# Patient Record
Sex: Male | Born: 1975 | Race: White | Hispanic: No | Marital: Married | Smoking: Current every day smoker
Health system: Southern US, Community
[De-identification: ages and names within clinical notes are randomized; demographics above are authoritative.]

## PROBLEM LIST (undated history)

## (undated) DIAGNOSIS — R7303 Prediabetes: Secondary | ICD-10-CM

## (undated) DIAGNOSIS — J189 Pneumonia, unspecified organism: Secondary | ICD-10-CM

## (undated) DIAGNOSIS — R000 Tachycardia, unspecified: Secondary | ICD-10-CM

## (undated) DIAGNOSIS — E039 Hypothyroidism, unspecified: Secondary | ICD-10-CM

## (undated) DIAGNOSIS — F909 Attention-deficit hyperactivity disorder, unspecified type: Secondary | ICD-10-CM

## (undated) DIAGNOSIS — R52 Pain, unspecified: Secondary | ICD-10-CM

## (undated) DIAGNOSIS — E119 Type 2 diabetes mellitus without complications: Secondary | ICD-10-CM

## (undated) DIAGNOSIS — M1990 Unspecified osteoarthritis, unspecified site: Secondary | ICD-10-CM

## (undated) DIAGNOSIS — F319 Bipolar disorder, unspecified: Secondary | ICD-10-CM

## (undated) DIAGNOSIS — Z8719 Personal history of other diseases of the digestive system: Secondary | ICD-10-CM

## (undated) DIAGNOSIS — Z87442 Personal history of urinary calculi: Secondary | ICD-10-CM

## (undated) DIAGNOSIS — R112 Nausea with vomiting, unspecified: Secondary | ICD-10-CM

## (undated) DIAGNOSIS — Z9889 Other specified postprocedural states: Secondary | ICD-10-CM

## (undated) DIAGNOSIS — F419 Anxiety disorder, unspecified: Secondary | ICD-10-CM

## (undated) DIAGNOSIS — R51 Headache: Secondary | ICD-10-CM

## (undated) DIAGNOSIS — I10 Essential (primary) hypertension: Secondary | ICD-10-CM

## (undated) DIAGNOSIS — K219 Gastro-esophageal reflux disease without esophagitis: Secondary | ICD-10-CM

## (undated) DIAGNOSIS — W3400XA Accidental discharge from unspecified firearms or gun, initial encounter: Secondary | ICD-10-CM

## (undated) HISTORY — PX: OTHER SURGICAL HISTORY: SHX169

## (undated) HISTORY — PX: KIDNEY STONE SURGERY: SHX686

## (undated) HISTORY — DX: Attention-deficit hyperactivity disorder, unspecified type: F90.9

## (undated) HISTORY — DX: Bipolar disorder, unspecified: F31.9

## (undated) HISTORY — PX: ORIF ULNAR FRACTURE: SHX5417

## (undated) HISTORY — DX: Type 2 diabetes mellitus without complications: E11.9

---

## 2003-09-02 ENCOUNTER — Encounter: Admission: RE | Admit: 2003-09-02 | Discharge: 2003-09-02 | Payer: Self-pay | Admitting: Specialist

## 2004-05-17 ENCOUNTER — Ambulatory Visit: Payer: Self-pay | Admitting: Internal Medicine

## 2004-07-01 ENCOUNTER — Ambulatory Visit (HOSPITAL_COMMUNITY): Admission: RE | Admit: 2004-07-01 | Discharge: 2004-07-01 | Payer: Self-pay | Admitting: Internal Medicine

## 2004-07-22 ENCOUNTER — Ambulatory Visit: Payer: Self-pay | Admitting: Internal Medicine

## 2011-07-05 DIAGNOSIS — W3400XA Accidental discharge from unspecified firearms or gun, initial encounter: Secondary | ICD-10-CM

## 2011-07-05 HISTORY — DX: Accidental discharge from unspecified firearms or gun, initial encounter: W34.00XA

## 2013-03-27 NOTE — Progress Notes (Signed)
Dr. Gioffre could you pleases put in orders in EPIC as patient has pre-op appt. 03/31/2013 at 0900 am. Thank you! 

## 2013-03-28 ENCOUNTER — Encounter (HOSPITAL_COMMUNITY): Payer: Self-pay | Admitting: Pharmacy Technician

## 2013-03-31 ENCOUNTER — Encounter (HOSPITAL_COMMUNITY)
Admission: RE | Admit: 2013-03-31 | Discharge: 2013-03-31 | Disposition: A | Discharge status: Home / Self Care | Payer: BC Managed Care – PPO | Source: Ambulatory Visit | Attending: Orthopedic Surgery | Admitting: Orthopedic Surgery

## 2013-03-31 ENCOUNTER — Encounter (HOSPITAL_COMMUNITY): Payer: Self-pay

## 2013-03-31 ENCOUNTER — Ambulatory Visit (HOSPITAL_COMMUNITY)
Admission: RE | Admit: 2013-03-31 | Discharge: 2013-03-31 | Disposition: A | Discharge status: Home / Self Care | Payer: BC Managed Care – PPO | Source: Ambulatory Visit | Attending: Surgical | Admitting: Surgical

## 2013-03-31 DIAGNOSIS — Z0183 Encounter for blood typing: Secondary | ICD-10-CM | POA: Insufficient documentation

## 2013-03-31 DIAGNOSIS — M545 Low back pain, unspecified: Secondary | ICD-10-CM | POA: Insufficient documentation

## 2013-03-31 DIAGNOSIS — I10 Essential (primary) hypertension: Secondary | ICD-10-CM | POA: Insufficient documentation

## 2013-03-31 DIAGNOSIS — Z01812 Encounter for preprocedural laboratory examination: Secondary | ICD-10-CM | POA: Insufficient documentation

## 2013-03-31 DIAGNOSIS — I998 Other disorder of circulatory system: Secondary | ICD-10-CM | POA: Insufficient documentation

## 2013-03-31 DIAGNOSIS — Z01818 Encounter for other preprocedural examination: Secondary | ICD-10-CM | POA: Insufficient documentation

## 2013-03-31 DIAGNOSIS — Z01810 Encounter for preprocedural cardiovascular examination: Secondary | ICD-10-CM | POA: Insufficient documentation

## 2013-03-31 HISTORY — DX: Headache: R51

## 2013-03-31 HISTORY — DX: Tachycardia, unspecified: R00.0

## 2013-03-31 HISTORY — DX: Pain, unspecified: R52

## 2013-03-31 HISTORY — DX: Accidental discharge from unspecified firearms or gun, initial encounter: W34.00XA

## 2013-03-31 HISTORY — DX: Hypothyroidism, unspecified: E03.9

## 2013-03-31 HISTORY — DX: Type 2 diabetes mellitus without complications: E11.9

## 2013-03-31 HISTORY — DX: Essential (primary) hypertension: I10

## 2013-03-31 HISTORY — DX: Other specified postprocedural states: Z98.890

## 2013-03-31 HISTORY — DX: Other specified postprocedural states: R11.2

## 2013-03-31 LAB — ABO/RH: ABO/RH(D): A POS

## 2013-03-31 LAB — COMPREHENSIVE METABOLIC PANEL
ALT: 69 U/L — ABNORMAL HIGH (ref 0–53)
AST: 34 U/L (ref 0–37)
Albumin: 4.2 g/dL (ref 3.5–5.2)
Alkaline Phosphatase: 65 U/L (ref 39–117)
BUN: 14 mg/dL (ref 6–23)
CO2: 26 mEq/L (ref 19–32)
Calcium: 9.7 mg/dL (ref 8.4–10.5)
Chloride: 95 mEq/L — ABNORMAL LOW (ref 96–112)
Creatinine, Ser: 0.96 mg/dL (ref 0.50–1.35)
GFR calc Af Amer: >90 mL/min (ref >90)
GFR calc non Af Amer: >90 mL/min (ref >90)
Glucose, Bld: 109 mg/dL — ABNORMAL HIGH (ref 70–99)
Potassium: 4.2 mEq/L (ref 3.7–5.3)
Sodium: 136 mEq/L — ABNORMAL LOW (ref 137–147)
Total Bilirubin: 0.2 mg/dL — ABNORMAL LOW (ref 0.3–1.2)
Total Protein: 7.8 g/dL (ref 6.0–8.3)

## 2013-03-31 LAB — URINALYSIS, ROUTINE W REFLEX MICROSCOPIC
Bilirubin Urine: NEGATIVE
Glucose, UA: NEGATIVE mg/dL
Hgb urine dipstick: NEGATIVE
Ketones, ur: NEGATIVE mg/dL
Leukocytes, UA: NEGATIVE
Nitrite: NEGATIVE
Protein, ur: NEGATIVE mg/dL
Specific Gravity, Urine: 1.021 (ref 1.005–1.030)
Urobilinogen, UA: 0.2 mg/dL (ref 0.0–1.0)
pH: 6 (ref 5.0–8.0)

## 2013-03-31 LAB — CBC WITH DIFFERENTIAL/PLATELET
Basophils Absolute: 0.1 10*3/uL (ref 0.0–0.1)
Basophils Relative: 1 % (ref 0–1)
Eosinophils Absolute: 0.3 10*3/uL (ref 0.0–0.7)
Eosinophils Relative: 3 % (ref 0–5)
HCT: 41.5 % (ref 39.0–52.0)
Hemoglobin: 14.4 g/dL (ref 13.0–17.0)
Lymphocytes Relative: 30 % (ref 12–46)
Lymphs Abs: 3.3 10*3/uL (ref 0.7–4.0)
MCH: 32.1 pg (ref 26.0–34.0)
MCHC: 34.7 g/dL (ref 30.0–36.0)
MCV: 92.6 fL (ref 78.0–100.0)
Monocytes Absolute: 1 10*3/uL (ref 0.1–1.0)
Monocytes Relative: 9 % (ref 3–12)
Neutro Abs: 6.3 10*3/uL (ref 1.7–7.7)
Neutrophils Relative %: 57 % (ref 43–77)
Platelets: 391 10*3/uL (ref 150–400)
RBC: 4.48 MIL/uL (ref 4.22–5.81)
RDW: 12.5 % (ref 11.5–15.5)
WBC: 11.1 10*3/uL — ABNORMAL HIGH (ref 4.0–10.5)

## 2013-03-31 LAB — SURGICAL PCR SCREEN
MRSA, PCR: NEGATIVE
STAPHYLOCOCCUS AUREUS: NEGATIVE

## 2013-03-31 LAB — APTT: aPTT: 28 seconds (ref 24–37)

## 2013-03-31 LAB — PROTIME-INR
INR: 0.85 (ref 0.00–1.49)
Prothrombin Time: 11.5 seconds — ABNORMAL LOW (ref 11.6–15.2)

## 2013-03-31 NOTE — Pre-Procedure Instructions (Signed)
EKG AND CXR AND BACK XRAYS WERE DONE TODAY - [facility] AS PER ORDERS DR. Darrelyn HillockGIOFFRE.

## 2013-03-31 NOTE — Patient Instructions (Signed)
[facility] SURG[facility] IS [facility]ED[facility]  ON:   Wednesday  1/28  REPORT TO  SHORT STAY CENTER AT:  5:30 AM      PHONE # FOR SHORT STAY IS [phone]  DO NOT EAT OR DRINK ANYTHING AFTER MIDNIGHT THE NIGHT BEFORE YOUR SURGERY.  YOU MAY BRUSH YOUR TEETH, RINSE OUT YOUR MOUTH--BUT NO WATER, NO FOOD, NO CHEWING GUM, NO MINTS, NO CANDIES, NO CHEWING TOBACCO.  PLEASE TAKE THE FOLLOWING MEDICATIONS THE AM OF YOUR SURGERY WITH A FEW SIPS OF WATER:   LEVOTHYROXINE, SIMVASTATIN   IF YOU ARE DIABETIC:  DO NOT TAKE ANY DIABETIC MEDICATIONS THE AM OF YOUR SURGERY.    DO NOT BRING VALUABLES, MONEY, CREDIT CARDS.  DO NOT WEAR JEWELRY, MAKE-UP, NAIL POLISH AND NO METAL PINS OR CLIPS IN YOUR HAIR. CONTACT LENS, DENTURES / PARTIALS, GLASSES SHOULD NOT BE WORN TO SURGERY AND IN MOST CASES-HEARING AIDS WILL NEED TO BE REMOVED.  BRING YOUR GLASSES CASE, ANY EQUIPMENT NEEDED FOR YOUR CONTACT LENS. FOR PATIENTS ADMITTED TO THE HOSPITAL--CHECK OUT TIME THE DAY OF DISCHARGE IS 11:00 AM.  ALL INPATIENT ROOMS ARE PRIVATE - WITH BATHROOM, TELEPHONE, TELEVISION AND WIFI INTERNET.                                                     PLEASE READ OVER ANY  FACT SHEETS THAT YOU WERE GIVEN: MRSA INFORMATION, BLOOD TRANSFUSION INFORMATION, INCENTIVE SPIROMETER INFORMATION.  FAILURE TO FOLLOW THESE INSTRUCTIONS MAY RESULT IN THE CANCELLATION OF YOUR SURGERY. PLEASE BE AWARE THAT YOU MAY NEED ADDITIONAL BLOOD DRAWN DAY OF YOUR SURGERY  PATIENT SIGNATURE_________________________________

## 2013-04-01 NOTE — Anesthesia Preprocedure Evaluation (Addendum)
Anesthesia Evaluation  Patient identified by MRN, date of birth, ID band Patient awake    Reviewed: Allergy & Precautions, H&P , NPO status , Patient's Chart, lab work & pertinent test results  History of Anesthesia Complications (+) PONV  Airway Mallampati: II TM Distance: >3 FB Neck ROM: Full    Dental  (+) Teeth Intact and Dental Advisory Given   Pulmonary neg pulmonary ROS, former smoker,  breath sounds clear to auscultation  Pulmonary exam normal       Cardiovascular hypertension, Pt. on medications Rhythm:Regular Rate:Normal     Neuro/Psych  Headaches, negative neurological ROS  negative psych ROS   GI/Hepatic negative GI ROS, Neg liver ROS,   Endo/Other  diabetes, Type 2, Oral Hypoglycemic AgentsHypothyroidism   Renal/GU negative Renal ROS  negative genitourinary   Musculoskeletal negative musculoskeletal ROS (+)   Abdominal   Peds  Hematology negative hematology ROS (+)   Anesthesia Other Findings   Reproductive/Obstetrics                          Anesthesia Physical Anesthesia Plan  ASA: III  Anesthesia Plan: General   Post-op Pain Management:    Induction: Intravenous  Airway Management Planned: Oral ETT  Additional Equipment:   Intra-op Plan:   Post-operative Plan: Extubation in OR  Informed Consent:   Dental advisory given  Plan Discussed with: CRNA  Anesthesia Plan Comments:         Anesthesia Quick Evaluation
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
## 2013-04-02 ENCOUNTER — Encounter (HOSPITAL_COMMUNITY): Payer: Self-pay | Admitting: *Deleted

## 2013-04-02 ENCOUNTER — Encounter (HOSPITAL_COMMUNITY): Payer: BC Managed Care – PPO | Admitting: Certified Registered Nurse Anesthetist

## 2013-04-02 ENCOUNTER — Ambulatory Visit (HOSPITAL_COMMUNITY): Payer: BC Managed Care – PPO | Admitting: Certified Registered Nurse Anesthetist

## 2013-04-02 ENCOUNTER — Ambulatory Visit (HOSPITAL_COMMUNITY): Payer: BC Managed Care – PPO

## 2013-04-02 ENCOUNTER — Observation Stay (HOSPITAL_COMMUNITY)
Admission: RE | Admit: 2013-04-02 | Discharge: 2013-04-03 | Disposition: A | Discharge status: Home / Self Care | Payer: BC Managed Care – PPO | Source: Ambulatory Visit | Attending: Orthopedic Surgery | Admitting: Orthopedic Surgery

## 2013-04-02 ENCOUNTER — Encounter (HOSPITAL_COMMUNITY)
Admission: RE | Disposition: A | Discharge status: Home / Self Care | Payer: Self-pay | Source: Ambulatory Visit | Attending: Orthopedic Surgery

## 2013-04-02 DIAGNOSIS — M48062 Spinal stenosis, lumbar region with neurogenic claudication: Secondary | ICD-10-CM

## 2013-04-02 DIAGNOSIS — E119 Type 2 diabetes mellitus without complications: Secondary | ICD-10-CM | POA: Insufficient documentation

## 2013-04-02 DIAGNOSIS — E039 Hypothyroidism, unspecified: Secondary | ICD-10-CM | POA: Insufficient documentation

## 2013-04-02 DIAGNOSIS — Z87891 Personal history of nicotine dependence: Secondary | ICD-10-CM | POA: Insufficient documentation

## 2013-04-02 DIAGNOSIS — Z79899 Other long term (current) drug therapy: Secondary | ICD-10-CM | POA: Insufficient documentation

## 2013-04-02 DIAGNOSIS — M5126 Other intervertebral disc displacement, lumbar region: Principal | ICD-10-CM | POA: Insufficient documentation

## 2013-04-02 DIAGNOSIS — I10 Essential (primary) hypertension: Secondary | ICD-10-CM | POA: Insufficient documentation

## 2013-04-02 DIAGNOSIS — M48061 Spinal stenosis, lumbar region without neurogenic claudication: Secondary | ICD-10-CM | POA: Diagnosis present

## 2013-04-02 HISTORY — PX: HEMI-MICRODISCECTOMY LUMBAR LAMINECTOMY LEVEL 1: SHX5846

## 2013-04-02 LAB — GLUCOSE, CAPILLARY
GLUCOSE-CAPILLARY: 104 mg/dL — AB (ref 70–99)
GLUCOSE-CAPILLARY: 205 mg/dL — AB (ref 70–99)
GLUCOSE-CAPILLARY: 177 mg/dL — AB (ref 70–99)
Glucose-Capillary: 92 mg/dL (ref 70–99)

## 2013-04-02 LAB — TYPE AND SCREEN
ABO/RH(D): A POS
Antibody Screen: NEGATIVE

## 2013-04-02 SURGERY — HEMI-MICRODISCECTOMY LUMBAR LAMINECTOMY LEVEL 1
Anesthesia: General | Site: Back | Laterality: Left

## 2013-04-02 MED ORDER — CHLORHEXIDINE GLUCONATE CLOTH 2 % EX PADS: 6.0000 | MEDICATED_PAD | Freq: Once | CUTANEOUS | Status: DC

## 2013-04-02 MED ORDER — METHOCARBAMOL 100 MG/ML IJ SOLN
500.0000 mg | Freq: Four times a day (QID) | INTRAVENOUS | Status: DC | PRN
  Administered 2013-04-02: 500 mg via INTRAVENOUS
  Filled 2013-04-02: qty 5

## 2013-04-02 MED ORDER — FENTANYL CITRATE 0.05 MG/ML IJ SOLN
INTRAMUSCULAR | Status: AC
  Filled 2013-04-02: qty 5

## 2013-04-02 MED ORDER — PROMETHAZINE HCL 25 MG/ML IJ SOLN: 6.2500 mg | INTRAMUSCULAR | Status: DC | PRN

## 2013-04-02 MED ORDER — LEVOTHYROXINE SODIUM 50 MCG PO TABS
50.0000 ug | ORAL_TABLET | Freq: Every day | ORAL | Status: DC
Start: 2013-04-03 — End: 2013-04-03
  Administered 2013-04-03: 50 ug via ORAL
  Filled 2013-04-02 (×2): qty 1

## 2013-04-02 MED ORDER — HYDROMORPHONE HCL PF 1 MG/ML IJ SOLN
INTRAMUSCULAR | Status: AC
  Filled 2013-04-02: qty 1

## 2013-04-02 MED ORDER — CLINDAMYCIN PHOSPHATE 900 MG/50ML IV SOLN
900.0000 mg | INTRAVENOUS | Status: AC
  Administered 2013-04-02: 900 mg via INTRAVENOUS

## 2013-04-02 MED ORDER — PHENYLEPHRINE HCL 10 MG/ML IJ SOLN
INTRAMUSCULAR | Status: AC
  Filled 2013-04-02: qty 1

## 2013-04-02 MED ORDER — PHENYLEPHRINE 40 MCG/ML (10ML) SYRINGE FOR IV PUSH (FOR BLOOD PRESSURE SUPPORT)
PREFILLED_SYRINGE | INTRAVENOUS | Status: AC
  Filled 2013-04-02: qty 20

## 2013-04-02 MED ORDER — ESMOLOL HCL 10 MG/ML IV SOLN
INTRAVENOUS | Status: AC
  Filled 2013-04-02: qty 10

## 2013-04-02 MED ORDER — PHENYLEPHRINE HCL 10 MG/ML IJ SOLN
INTRAMUSCULAR | Status: DC | PRN
  Administered 2013-04-02 (×4): 80 ug via INTRAVENOUS
  Administered 2013-04-02: 40 ug via INTRAVENOUS
  Administered 2013-04-02 (×2): 80 ug via INTRAVENOUS

## 2013-04-02 MED ORDER — BISACODYL 10 MG RE SUPP: 10.0000 mg | Freq: Every day | RECTAL | Status: DC | PRN

## 2013-04-02 MED ORDER — METHOCARBAMOL 500 MG PO TABS
500.0000 mg | ORAL_TABLET | Freq: Four times a day (QID) | ORAL | Status: DC | PRN
  Administered 2013-04-02 – 2013-04-03 (×2): 500 mg via ORAL
  Filled 2013-04-02 (×2): qty 1

## 2013-04-02 MED ORDER — ESMOLOL HCL 10 MG/ML IV SOLN
INTRAVENOUS | Status: DC | PRN
  Administered 2013-04-02 (×2): 20 mg via INTRAVENOUS

## 2013-04-02 MED ORDER — CLINDAMYCIN PHOSPHATE 600 MG/50ML IV SOLN
600.0000 mg | Freq: Three times a day (TID) | INTRAVENOUS | Status: AC
  Administered 2013-04-02 (×2): 600 mg via INTRAVENOUS
  Filled 2013-04-02 (×2): qty 50

## 2013-04-02 MED ORDER — INSULIN ASPART 100 UNIT/ML ~~LOC~~ SOLN
0.0000 [IU] | Freq: Three times a day (TID) | SUBCUTANEOUS | Status: DC
  Administered 2013-04-02: 5 [IU] via SUBCUTANEOUS
  Administered 2013-04-03: 2 [IU] via SUBCUTANEOUS

## 2013-04-02 MED ORDER — NEOSTIGMINE METHYLSULFATE 1 MG/ML IJ SOLN
INTRAMUSCULAR | Status: AC
  Filled 2013-04-02: qty 10

## 2013-04-02 MED ORDER — HYDROMORPHONE HCL PF 1 MG/ML IJ SOLN
INTRAMUSCULAR | Status: AC
  Filled 2013-04-02: qty 2

## 2013-04-02 MED ORDER — HYDROCODONE-ACETAMINOPHEN 5-325 MG PO TABS
1.0000 | ORAL_TABLET | ORAL | Status: DC | PRN
  Administered 2013-04-02 – 2013-04-03 (×5): 2 via ORAL
  Filled 2013-04-02 (×5): qty 2

## 2013-04-02 MED ORDER — PROPOFOL 10 MG/ML IV BOLUS
INTRAVENOUS | Status: DC | PRN
  Administered 2013-04-02: 200 mg via INTRAVENOUS

## 2013-04-02 MED ORDER — SIMVASTATIN 20 MG PO TABS
20.0000 mg | ORAL_TABLET | Freq: Every day | ORAL | Status: DC
  Administered 2013-04-03: 20 mg via ORAL
  Filled 2013-04-02: qty 1

## 2013-04-02 MED ORDER — DEXAMETHASONE SODIUM PHOSPHATE 10 MG/ML IJ SOLN
INTRAMUSCULAR | Status: DC | PRN
  Administered 2013-04-02: 10 mg via INTRAVENOUS

## 2013-04-02 MED ORDER — HYDROMORPHONE HCL PF 1 MG/ML IJ SOLN
0.2500 mg | INTRAMUSCULAR | Status: DC | PRN
  Administered 2013-04-02 (×4): 0.5 mg via INTRAVENOUS

## 2013-04-02 MED ORDER — LACTATED RINGERS IV SOLN: INTRAVENOUS | Status: DC

## 2013-04-02 MED ORDER — ROCURONIUM BROMIDE 100 MG/10ML IV SOLN
INTRAVENOUS | Status: AC
  Filled 2013-04-02: qty 1

## 2013-04-02 MED ORDER — HYDROMORPHONE HCL PF 1 MG/ML IJ SOLN
0.5000 mg | INTRAMUSCULAR | Status: DC | PRN
  Administered 2013-04-02 – 2013-04-03 (×5): 1 mg via INTRAVENOUS
  Filled 2013-04-02 (×5): qty 1

## 2013-04-02 MED ORDER — LACTATED RINGERS IV SOLN
INTRAVENOUS | Status: DC
  Administered 2013-04-03: 02:00:00 via INTRAVENOUS

## 2013-04-02 MED ORDER — SUCCINYLCHOLINE CHLORIDE 20 MG/ML IJ SOLN
INTRAMUSCULAR | Status: AC
  Filled 2013-04-02: qty 1

## 2013-04-02 MED ORDER — BUPIVACAINE-EPINEPHRINE 0.5% -1:200000 IJ SOLN
INTRAMUSCULAR | Status: AC
  Filled 2013-04-02: qty 1

## 2013-04-02 MED ORDER — OXYCODONE-ACETAMINOPHEN 5-325 MG PO TABS: 1.0000 | ORAL_TABLET | ORAL | Status: DC | PRN

## 2013-04-02 MED ORDER — LIDOCAINE HCL (CARDIAC) 20 MG/ML IV SOLN
INTRAVENOUS | Status: DC | PRN
  Administered 2013-04-02: 80 mg via INTRAVENOUS

## 2013-04-02 MED ORDER — PROPOFOL 10 MG/ML IV BOLUS
INTRAVENOUS | Status: AC
  Filled 2013-04-02: qty 20

## 2013-04-02 MED ORDER — ALPRAZOLAM 1 MG PO TABS
2.0000 mg | ORAL_TABLET | Freq: Every day | ORAL | Status: DC
  Administered 2013-04-02: 2 mg via ORAL
  Filled 2013-04-02: qty 2

## 2013-04-02 MED ORDER — THROMBIN 5000 UNITS EX SOLR
CUTANEOUS | Status: DC | PRN
  Administered 2013-04-02: 5000 [IU] via TOPICAL

## 2013-04-02 MED ORDER — FENTANYL CITRATE 0.05 MG/ML IJ SOLN
INTRAMUSCULAR | Status: DC | PRN
  Administered 2013-04-02: 50 ug via INTRAVENOUS
  Administered 2013-04-02: 100 ug via INTRAVENOUS
  Administered 2013-04-02 (×3): 50 ug via INTRAVENOUS

## 2013-04-02 MED ORDER — PHENYLEPHRINE HCL 10 MG/ML IJ SOLN
10.0000 mg | INTRAVENOUS | Status: DC | PRN
  Administered 2013-04-02: 50 ug/min via INTRAVENOUS

## 2013-04-02 MED ORDER — LISINOPRIL-HYDROCHLOROTHIAZIDE 20-25 MG PO TABS: 1.0000 | ORAL_TABLET | ORAL | Status: DC

## 2013-04-02 MED ORDER — LACTATED RINGERS IV SOLN
INTRAVENOUS | Status: DC
Start: 2013-04-02 — End: 2013-04-02
  Administered 2013-04-02 (×2): via INTRAVENOUS
  Administered 2013-04-02: 1000 mL via INTRAVENOUS

## 2013-04-02 MED ORDER — SODIUM CHLORIDE 0.9 % IR SOLN
Status: DC | PRN
  Administered 2013-04-02: 09:00:00

## 2013-04-02 MED ORDER — THROMBIN 5000 UNITS EX SOLR
CUTANEOUS | Status: AC
  Filled 2013-04-02: qty 10000

## 2013-04-02 MED ORDER — HYDROMORPHONE HCL PF 1 MG/ML IJ SOLN
0.2500 mg | INTRAMUSCULAR | Status: DC | PRN
  Administered 2013-04-02: 0.5 mg via INTRAVENOUS

## 2013-04-02 MED ORDER — CLINDAMYCIN PHOSPHATE 900 MG/50ML IV SOLN
INTRAVENOUS | Status: AC
  Filled 2013-04-02: qty 50

## 2013-04-02 MED ORDER — BUPIVACAINE-EPINEPHRINE PF 0.5-1:200000 % IJ SOLN
INTRAMUSCULAR | Status: DC | PRN
  Administered 2013-04-02: 20 mL

## 2013-04-02 MED ORDER — BACITRACIN-NEOMYCIN-POLYMYXIN 400-5-5000 EX OINT
TOPICAL_OINTMENT | CUTANEOUS | Status: AC
  Filled 2013-04-02: qty 1

## 2013-04-02 MED ORDER — LISINOPRIL 20 MG PO TABS
20.0000 mg | ORAL_TABLET | Freq: Every day | ORAL | Status: DC
  Administered 2013-04-03: 20 mg via ORAL
  Filled 2013-04-02: qty 1

## 2013-04-02 MED ORDER — LIDOCAINE HCL (CARDIAC) 20 MG/ML IV SOLN
INTRAVENOUS | Status: AC
  Filled 2013-04-02: qty 5

## 2013-04-02 MED ORDER — ACETAMINOPHEN 325 MG PO TABS: 650.0000 mg | ORAL_TABLET | ORAL | Status: DC | PRN

## 2013-04-02 MED ORDER — GLYCOPYRROLATE 0.2 MG/ML IJ SOLN
INTRAMUSCULAR | Status: DC | PRN
  Administered 2013-04-02: .8 mg via INTRAVENOUS

## 2013-04-02 MED ORDER — HYDROCHLOROTHIAZIDE 25 MG PO TABS
25.0000 mg | ORAL_TABLET | Freq: Every day | ORAL | Status: DC
  Administered 2013-04-03: 25 mg via ORAL
  Filled 2013-04-02: qty 1

## 2013-04-02 MED ORDER — ONDANSETRON HCL 4 MG/2ML IJ SOLN
INTRAMUSCULAR | Status: AC
  Filled 2013-04-02: qty 2

## 2013-04-02 MED ORDER — ACETAMINOPHEN 650 MG RE SUPP: 650.0000 mg | RECTAL | Status: DC | PRN

## 2013-04-02 MED ORDER — MENTHOL 3 MG MT LOZG: 1.0000 | LOZENGE | OROMUCOSAL | Status: DC | PRN

## 2013-04-02 MED ORDER — ROCURONIUM BROMIDE 100 MG/10ML IV SOLN
INTRAVENOUS | Status: DC | PRN
  Administered 2013-04-02: 5 mg via INTRAVENOUS
  Administered 2013-04-02: 50 mg via INTRAVENOUS

## 2013-04-02 MED ORDER — MIDAZOLAM HCL 2 MG/2ML IJ SOLN
INTRAMUSCULAR | Status: AC
  Filled 2013-04-02: qty 2

## 2013-04-02 MED ORDER — HYDROCHLOROTHIAZIDE 25 MG PO TABS: 12.5000 mg | ORAL_TABLET | Freq: Every morning | ORAL | Status: DC

## 2013-04-02 MED ORDER — FLEET ENEMA 7-19 GM/118ML RE ENEM: 1.0000 | ENEMA | Freq: Once | RECTAL | Status: AC | PRN

## 2013-04-02 MED ORDER — PHENOL 1.4 % MT LIQD: 1.0000 | OROMUCOSAL | Status: DC | PRN

## 2013-04-02 MED ORDER — DEXAMETHASONE SODIUM PHOSPHATE 10 MG/ML IJ SOLN
INTRAMUSCULAR | Status: AC
  Filled 2013-04-02: qty 1

## 2013-04-02 MED ORDER — MIDAZOLAM HCL 5 MG/5ML IJ SOLN
INTRAMUSCULAR | Status: DC | PRN
  Administered 2013-04-02: 2 mg via INTRAVENOUS

## 2013-04-02 MED ORDER — POLYETHYLENE GLYCOL 3350 17 G PO PACK
17.0000 g | PACK | Freq: Every day | ORAL | Status: DC | PRN
  Administered 2013-04-03: 17 g via ORAL
  Filled 2013-04-02: qty 1

## 2013-04-02 MED ORDER — ACETAMINOPHEN 10 MG/ML IV SOLN
1000.0000 mg | Freq: Once | INTRAVENOUS | Status: AC
  Administered 2013-04-02: 1000 mg via INTRAVENOUS
  Filled 2013-04-02: qty 100

## 2013-04-02 MED ORDER — BACITRACIN-NEOMYCIN-POLYMYXIN 400-5-5000 EX OINT
TOPICAL_OINTMENT | CUTANEOUS | Status: DC | PRN
  Administered 2013-04-02: 1 via TOPICAL

## 2013-04-02 MED ORDER — GLYCOPYRROLATE 0.2 MG/ML IJ SOLN
INTRAMUSCULAR | Status: AC
  Filled 2013-04-02: qty 3

## 2013-04-02 MED ORDER — METFORMIN HCL 500 MG PO TABS
500.0000 mg | ORAL_TABLET | Freq: Every day | ORAL | Status: DC
  Administered 2013-04-03: 500 mg via ORAL
  Filled 2013-04-02 (×2): qty 1

## 2013-04-02 MED ORDER — BUPIVACAINE LIPOSOME 1.3 % IJ SUSP
20.0000 mL | Freq: Once | INTRAMUSCULAR | Status: AC
  Administered 2013-04-02: 20 mL
  Filled 2013-04-02: qty 20

## 2013-04-02 MED ORDER — ONDANSETRON HCL 4 MG/2ML IJ SOLN: 4.0000 mg | INTRAMUSCULAR | Status: DC | PRN

## 2013-04-02 MED ORDER — SUCCINYLCHOLINE CHLORIDE 20 MG/ML IJ SOLN
INTRAMUSCULAR | Status: DC | PRN
  Administered 2013-04-02: 140 mg via INTRAVENOUS

## 2013-04-02 MED ORDER — NEOSTIGMINE METHYLSULFATE 1 MG/ML IJ SOLN
INTRAMUSCULAR | Status: DC | PRN
  Administered 2013-04-02: 5 mg via INTRAVENOUS

## 2013-04-02 SURGICAL SUPPLY — 44 items
APL SKNCLS STERI-STRIP NONHPOA (GAUZE/BANDAGES/DRESSINGS) ×1
BAG SPEC THK2 15X12 ZIP CLS (MISCELLANEOUS) ×1
BAG ZIPLOCK 12X15 (MISCELLANEOUS) ×2 IMPLANT
BENZOIN TINCTURE PRP APPL 2/3 (GAUZE/BANDAGES/DRESSINGS) ×2 IMPLANT
CLEANER TIP ELECTROSURG 2X2 (MISCELLANEOUS) ×2 IMPLANT
CONT SPECI 4OZ STER CLIK (MISCELLANEOUS) ×1 IMPLANT
DRAIN PENROSE 18X1/4 LTX STRL (WOUND CARE) IMPLANT
DRAPE MICROSCOPE LEICA (MISCELLANEOUS) ×2 IMPLANT
DRAPE POUCH INSTRU U-SHP 10X18 (DRAPES) ×2 IMPLANT
DRAPE SURG 17X11 SM STRL (DRAPES) ×2 IMPLANT
DRSG ADAPTIC 3X8 NADH LF (GAUZE/BANDAGES/DRESSINGS) ×2 IMPLANT
DRSG PAD ABDOMINAL 8X10 ST (GAUZE/BANDAGES/DRESSINGS) ×1 IMPLANT
DURAPREP 26ML APPLICATOR (WOUND CARE) ×2 IMPLANT
ELECT REM PT RETURN 9FT ADLT (ELECTROSURGICAL) ×2
ELECTRODE REM PT RTRN 9FT ADLT (ELECTROSURGICAL) ×1 IMPLANT
GLOVE BIOGEL PI IND STRL 8 (GLOVE) ×1 IMPLANT
GLOVE BIOGEL PI INDICATOR 8 (GLOVE) ×1
GLOVE ECLIPSE 8.0 STRL XLNG CF (GLOVE) ×4 IMPLANT
GOWN STRL REUS W/TWL LRG LVL3 (GOWN DISPOSABLE) ×6 IMPLANT
GOWN STRL REUS W/TWL XL LVL3 (GOWN DISPOSABLE) ×4 IMPLANT
KIT BASIN OR (CUSTOM PROCEDURE TRAY) ×2 IMPLANT
KIT POSITIONING SURG ANDREWS (MISCELLANEOUS) ×2 IMPLANT
MANIFOLD NEPTUNE II (INSTRUMENTS) ×2 IMPLANT
NDL SPNL 18GX3.5 QUINCKE PK (NEEDLE) ×2 IMPLANT
NEEDLE SPNL 18GX3.5 QUINCKE PK (NEEDLE) ×4 IMPLANT
NS IRRIG 1000ML POUR BTL (IV SOLUTION) ×1 IMPLANT
PAD ABD 8X10 STRL (GAUZE/BANDAGES/DRESSINGS) ×2 IMPLANT
PATTIES SURGICAL .5 X.5 (GAUZE/BANDAGES/DRESSINGS) IMPLANT
PATTIES SURGICAL .75X.75 (GAUZE/BANDAGES/DRESSINGS) IMPLANT
PATTIES SURGICAL 1X1 (DISPOSABLE) IMPLANT
PIN SAFETY NICK PLATE  2 MED (MISCELLANEOUS)
PIN SAFETY NICK PLATE 2 MED (MISCELLANEOUS) IMPLANT
POSITIONER SURGICAL ARM (MISCELLANEOUS) ×2 IMPLANT
SPONGE GAUZE 4X4 12PLY (GAUZE/BANDAGES/DRESSINGS) ×1 IMPLANT
SPONGE LAP 4X18 X RAY DECT (DISPOSABLE) IMPLANT
SPONGE SURGIFOAM ABS GEL 100 (HEMOSTASIS) ×2 IMPLANT
STAPLER VISISTAT 35W (STAPLE) IMPLANT
SUT VIC AB 0 CT1 27 (SUTURE) ×2
SUT VIC AB 0 CT1 27XBRD ANTBC (SUTURE) ×1 IMPLANT
SUT VIC AB 1 CT1 27 (SUTURE) ×8
SUT VIC AB 1 CT1 27XBRD ANTBC (SUTURE) ×4 IMPLANT
TOWEL OR 17X26 10 PK STRL BLUE (TOWEL DISPOSABLE) ×4 IMPLANT
TRAY LAMINECTOMY (CUSTOM PROCEDURE TRAY) ×2 IMPLANT
WATER STERILE IRR 1500ML POUR (IV SOLUTION) ×1 IMPLANT

## 2013-04-02 NOTE — Preoperative (Signed)
Beta Blockers   Reason not to administer Beta Blockers:Not Applicable 

## 2013-04-02 NOTE — Progress Notes (Signed)
Sat on side of bed to try to urinate. Unable to do so. C/O pain Scale 8. Dr. Rica MastFortune called for additional orders.

## 2013-04-02 NOTE — H&P (View-Only) (Signed)
Dr. Darrelyn HillockGioffre could you pleases put in orders in EPIC as patient has pre-op appt. 03/31/2013 at 0900 am. Thank you!

## 2013-04-02 NOTE — Op Note (Signed)
NAMMarland Kitchen:  Carolan ShiverKALLAM, Braydn                 ACCOUNT NO.:  [id number]  MEDICAL RECORD NO.:  [id number]  LOCATION:  1533                         [facility]  PHYSICIAN:  Georges Lynchonald A. Graycie Halley, M.D.DATE OF BIRTH:  1975-06-09  DATE OF PROCEDURE:  04/02/2013 DATE OF DISCHARGE:                              OPERATIVE REPORT   PREOPERATIVE DIAGNOSES: 1. Herniated lumbar disk at L4-5 on the left with left leg pain. 2. 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  POSTOPERATIVE DIAGNOSES: 1. Herniated lumbar disk at L4-5 on the left with left leg pain. 2. 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  OPERATION: 1. Decompression of the lateral recess at L4-L5 by utilizing the     microscope on the left. 2. Microdiskectomy at L4-5 on the left.  The specimen was sent to the     lab.  SURGEON:  Georges Lynchonald A. Darrelyn HillockGioffre, M.D.  ASSISTANT:  Jene EveryJeffrey Beane, MD.  DESCRIPTION OF PROCEDURE:  Under general anesthesia, routine orthopedic prep and drape was carried out with the patient on the spinal frame. After sterile prep and draping, appropriate time-out was carried out.  I also marked the appropriate left side of the back in the holding area. At this time, after the time-out 2 needles were placed in the back for localization purposes.  X-ray was taken.  Once we verified the space, an incision was made over the L4-5 space and extended proximally and distally.  At this time, the muscle was separated from the lamina and spinous process bilaterally.  Following that, I inserted an instrument between the 4-5 space superficially and then utilized a Kocher clamp on the L4 spinous process.  Another x-ray was taken to verify the position. I then went down, carried out my hemilaminectomy in the usual fashion. The microscope was brought in.  I carried out hemilaminectomy at L4-L5 on the left.  The ligamentum flavum was removed.  Great care was taken not to injure the dura.  Following that, we completed the lateral  recess decompression for stenosis.  We went proximally and distally and made sure we had an excellent opening there for exploration of the nerve.  We did a foraminotomy for the L5 root.  We gently retracted the root and the dura at this time, and identified the disk space.  We cauterized lateral recess veins with a bipolar.  I then inserted a needle into disk space and x-ray was taken to verify L4-5 space.  Following that, a cruciate incision was made in the posterior longitudinal ligament and a complete diskectomy was carried out.  Also utilized the nerve hook as well as the Epstein curettes to make sure we decompressed the disk into the space.  We then went up into the foramina of L4 root above and the L5 root distally and they were both now free.  The dura was free and the nerve root was totally the very movable at this point.  I thoroughly irrigated out the area, loosely applied some thrombin-soaked Gelfoam.  I closed the wound layers in usual fashion.  I left a small deep distal and proximal part of the wound open for drainage purposes.  Noted the  beginning of the case, I injected 20 mL of 0.5% Marcaine with epinephrine into the soft tissue for to control bleeding.  At the end of the case, I injected 20 mL of Exparel into the soft tissue.  Sterile Neosporin dressings were applied.  The patient had 960 mg of clindamycin preop.          ______________________________ Georges Lynch. Darrelyn Hillock, M.D.     RAG/MEDQ  D:  04/02/2013  T:  04/02/2013  Job:  [id number]

## 2013-04-02 NOTE — Interval H&P Note (Signed)
History and Physical Interval Note:  04/02/2013 7:18 AM  Glen ReeveJohn G Jacobson  has presented today for surgery, with the diagnosis of LEFT HERNIATED DISC  The various methods of treatment have been discussed with the patient and family. After consideration of risks, benefits and other options for treatment, the patient has consented to  Procedure(s): HEMI-MICRODISCECTOMY LUMBAR LAMINECTOMY L4-L5 ON LEFT (Left) as a surgical intervention .  The patient's history has been reviewed, patient examined, no change in status, stable for surgery.  I have reviewed the patient's chart and labs.  Questions were answered to the patient's satisfaction.     Devyn Griffing A

## 2013-04-02 NOTE — Anesthesia Postprocedure Evaluation (Signed)
Anesthesia Post Note  Patient: Glen ReeveJohn G Jacobson  Procedure(s) Performed: Procedure(s) (LRB): HEMI-MICRODISCECTOMY LUMBAR LAMINECTOMY L4-L5 ON LEFT (Left)  Anesthesia type: General  Patient location: PACU  Post pain: Pain level controlled  Post assessment: Post-op Vital signs reviewed  Last Vitals:  Filed Vitals:   04/02/13 1200  BP: 90/45  Pulse: 52  Temp:   Resp: 12    Post vital signs: Reviewed  Level of consciousness: sedated  Complications: No apparent anesthesia complications

## 2013-04-02 NOTE — Transfer of Care (Signed)
Immediate Anesthesia Transfer of Care Note  Patient: Glen ReeveJohn G Kallam  Procedure(s) Performed: Procedure(s) (LRB): HEMI-MICRODISCECTOMY LUMBAR LAMINECTOMY L4-L5 ON LEFT (Left)  Patient Location: PACU  Anesthesia Type: General  Level of Consciousness: sedated, patient cooperative and responds to stimulation  Airway & Oxygen Therapy: Patient Spontanous Breathing and Patient connected to face mask oxgen  Post-op Assessment: Report given to PACU RN and Post -op Vital signs reviewed and stable  Post vital signs: Reviewed and stable  Complications: No apparent anesthesia complications

## 2013-04-02 NOTE — Brief Op Note (Signed)
04/02/2013  9:24 AM  PATIENT:  Glen Jacobson  38 y.o. male  PRE-OPERATIVE DIAGNOSIS:  LEFT HERNIATED DISC ,Lumbar at L-4-L-5 and Lateral Recess Stenosis  POST-OPERATIVE DIAGNOSIS:  LEFT HERNIATED DISC,Lumbar at L-4-L-5 and Lateral Recess Stenosis  PROCEDURE:  Procedure(s): HEMI-MICRODISCECTOMY LUMBAR LAMINECTOMY L4-L5 ON LEFT (Left) and Decompression of L-4-L-5 Recess For Stenosis.  SURGEON:  Surgeon(s) and Role:    * Jacki Conesonald A Vicky Mccanless, MD - Primary    * Javier DockerJeffrey C Beane, MD - Assisting     ASSISTANTS: Jene EveryJeffrey Beane MD  ANESTHESIA:   general  EBL:  Total I/O In: 1500 [I.V.:1500] Out: -   BLOOD ADMINISTERED:none  DRAINS: none   LOCAL MEDICATIONS USED:  MARCAINE  20cc of 0.50% with Epinephrine at the beginning of the case and 20cc of Exparel at the end of the case   SPECIMEN:  Source of Specimen:  L-4-L-5 interspace  DISPOSITION OF SPECIMEN:  PATHOLOGY  COUNTS:  YES  TOURNIQUET:  * No tourniquets in log *  DICTATION: .Other Dictation: Dictation Number [id number]  PLAN OF CARE: Admit for overnight observation  PATIENT DISPOSITION:  PACU - hemodynamically stable.   Delay start of Pharmacological VTE agent (>24hrs) due to surgical blood loss or risk of bleeding: yes

## 2013-04-02 NOTE — Progress Notes (Signed)
Sitting on side of bed for comfort and to try to urinate. Tolerating water without N/V.

## 2013-04-02 NOTE — Plan of Care (Signed)
Problem: Phase I Progression Outcomes Goal: Vital signs/hemodynamically stable

## 2013-04-03 ENCOUNTER — Encounter (HOSPITAL_COMMUNITY): Payer: Self-pay | Admitting: Orthopedic Surgery

## 2013-04-03 LAB — GLUCOSE, CAPILLARY: Glucose-Capillary: 139 mg/dL — ABNORMAL HIGH (ref 70–99)

## 2013-04-03 MED ORDER — LIVING WELL WITH DIABETES BOOK
Freq: Once | Status: AC
  Administered 2013-04-03: 09:00:00
  Filled 2013-04-03: qty 1

## 2013-04-03 MED ORDER — OXYCODONE-ACETAMINOPHEN 10-325 MG PO TABS
1.0000 | ORAL_TABLET | ORAL | Status: DC | PRN
Start: 2013-04-03 — End: 2013-11-07

## 2013-04-03 MED ORDER — METHOCARBAMOL 500 MG PO TABS: 500.0000 mg | ORAL_TABLET | Freq: Three times a day (TID) | ORAL | Status: DC | PRN

## 2013-04-03 NOTE — Evaluation (Signed)
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  All education was completed.  Pt does not need any further OT at this time.    OT Assessment  Patient does not need any further OT services    Follow Up Recommendations  No OT follow up    Barriers to Discharge      Equipment Recommendations  None recommended by OT    Recommendations for Other Services    Frequency       Precautions / Restrictions Precautions Precautions: Back Restrictions Weight Bearing Restrictions: No   Pertinent Vitals/Pain Back OK but unable to tolerate lying at this time:  Hurts incision and sidelying uncomfortable. Sitting unsupported in bed most comfortable    ADL  Toilet Transfer: Independent Toilet Transfer Method: Sit to stand Transfers/Ambulation Related to ADLs: pt has been walking to bathroom independently:  supervision for sit to/from stand  Noted to turn body en bloc.  When getting back to bed, he cannot tolerate lying and rolling.  He lifts a leg and sits:  cued not to lean forward ADL Comments: reviewed adl alternatives following back precautions:  He has been avoiding socks and wearing slip on crocs.  Normally he crosses legs but is unable to do so comfortably now.  UB adls are set up level and LB are mod A (pants/crocs).  Educated on toilet aide.  Also educated on sidestepping and kicking leg back to get into tub following precautions    OT Diagnosis:    OT Problem List:   OT Treatment Interventions:     OT Goals(Current goals can be found in the care plan section)    Visit Information  Last OT Received On: 04/03/13 Assistance Needed: +1 History of Present Illness: pt was admitted  for L4-5 decompression due to HNP       Prior Functioning     Home Living Family/patient expects to be discharged to:: Private residence Living Arrangements: Spouse/significant other Type of Home: House Home Access: Stairs to enter Secretary/administratorntrance Stairs-Number of Steps: 1 small Home Layout: One level Prior Function Level of Independence: Needs assistance (LB adls) Communication Communication: No difficulties         Vision/Perception     Cognition  Cognition Arousal/Alertness: Awake/alert Behavior During Therapy: WFL for tasks assessed/performed Overall Cognitive Status: Within Functional Limits for tasks assessed    Extremity/Trunk Assessment Upper Extremity Assessment Upper Extremity Assessment: Overall WFL for tasks assessed     Mobility       Exercise     Balance     End of Session OT - End of Session Activity Tolerance: Patient tolerated treatment well Patient left: in bed;with call bell/phone within reach;with family/visitor present  GO Functional Assessment Tool Used: clinical judgment/observation Functional Limitation: Self care Self Care Current Status (Y7829(G8987): At least 40 percent but less than 60 percent impaired, limited or restricted Self Care Goal Status (F6213(G8988): At least 40 percent but less than 60 percent impaired, limited or restricted Self Care Discharge Status [phone](G8989): At least 40 percent but less than 60 percent impaired, limited or restricted   [facility] IncENCER,Shayna Eblen 04/03/2013, 9:13 AM Glen Jacobson, OTR/L [phone] 04/03/2013

## 2013-04-03 NOTE — Progress Notes (Signed)
04/03/13 [id number] Paged Dr Darrelyn HillockGioffre For Diabetic Consult and a prescription for glucose meter and test strips.

## 2013-04-03 NOTE — Progress Notes (Signed)
Inpatient Diabetes Program Recommendations  AACE/ADA: New Consensus Statement on Inpatient Glycemic Control (2013)  Target Ranges:  Prepandial:   less than 140 mg/dL      Peak postprandial:   less than 180 mg/dL (1-2 hours)      Critically ill patients:  140 - 180 mg/dL   Reason for Visit: Diabetes Consult  Diabetes history: Type 2 Outpatient Diabetes medications: metformin 500mg  bid Current orders for Inpatient glycemic control: Novolog mod tidwc  Pt recently diagnosed with DM2 and was put on metformin 500 mg bid. Has recently lost 14 pounds and is very interested in controlling blood sugars at home. Requests prescription for meter and supplies. [employer]ed Amber PA and order was placed. Long discussion regarding diagnosis of DM, how diet, exercise and stress affects blood sugars, and monitoring.  Encouraged pt to view diabetes videos on pt ed channel and Living Well book given. Answered questions and pt seems very motivated to make lifestyle changes to control blood sugars. [place] and [facility] Will order same. Stressed importance of f/u with PCP with blood sugar logs for MD to make adjustments to his diabetes medications.  Results for Glen ReeveKALLAM, Sanay G (MRN [id number]) as of 04/03/2013 09:36  Ref. Range 04/02/2013 05:48 04/02/2013 12:45 04/02/2013 16:58 04/02/2013 21:34 04/03/2013 07:24  Glucose-Capillary Latest Range: 70-99 mg/dL 92 725104 (H) 366205 (H) 440177 (H) 139 (H)   Well-controlled here in hospital. Discussed above with RN.  Thank you. Ailene Ardshonda Terissa Haffey, RD, LDN, CDE Inpatient Diabetes Coordinator [phone]

## 2013-04-03 NOTE — Progress Notes (Signed)
04/03/13 1200 Reviewed discharge instructions with patient and wife. Both verbalized understanding of discharge. Copy of discharge papers and prescriptions given to patient.

## 2013-04-03 NOTE — Evaluation (Signed)
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 Attempted ambulation without pole support but difficulty increased and pain increased as well. Discussed use of cane-feel pt would benefit from this. All education completed. No further questions from pt/family. Recommend straight cane for ambulation.     PT Assessment  Patent does not need any further PT services    Follow Up Recommendations  No PT follow up    Does the patient have the potential to tolerate intense rehabilitation      Barriers to Discharge        Equipment Recommendations  Cane    Recommendations for Other Services OT consult   Frequency      Precautions / Restrictions Precautions Precautions: Back Precaution Booklet Issued: Yes (comment) Precaution Comments: Reviewed back precautions and logroll technique. Pt familiar from OT visit earlier Restrictions Weight Bearing Restrictions: No   Pertinent Vitals/Pain 8/10 back with activity. Requested pain meds from RN end of session      Mobility  Bed Mobility Overal bed mobility: Modified Independent Transfers Overall transfer level: Modified independent Ambulation/Gait Ambulation/Gait assistance: Supervision Ambulation Distance (Feet): 500 Feet Assistive device:  (IV pole) Gait Pattern/deviations: Step-through pattern General Gait Details: Increased pain when attempting to ambulate without 1 hand support. discussed cane use-feel pt would benefit    Exercises     PT Diagnosis:    PT Problem List:   PT Treatment Interventions:       PT Goals(Current goals can be found in the care plan  section) Acute Rehab PT Goals Patient Stated Goal: less pain. 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       Prior Functioning  Home Living Family/patient expects to be discharged to:: Private residence Living Arrangements: Spouse/significant other Type of Home: House Home Access: Stairs to enter Secretary/administratorntrance Stairs-Number of Steps: 1 small Home Layout: One level Prior Function Level of Independence: Needs assistance (LB adls) Communication Communication: No difficulties    Cognition  Cognition Arousal/Alertness: Awake/alert Behavior During Therapy: WFL for tasks assessed/performed Overall Cognitive Status: Within Functional Limits for tasks assessed    Extremity/Trunk Assessment Upper Extremity Assessment Upper Extremity Assessment: Defer to OT evaluation Lower Extremity Assessment Lower Extremity Assessment: Overall WFL for tasks assessed Cervical / Trunk Assessment Cervical / Trunk Assessment: Normal   Balance    End of Session PT - End of Session Activity Tolerance: Patient limited by pain;Patient tolerated treatment well Patient left: in bed;with call bell/phone within reach;with family/visitor present  GP Functional Assessment Tool Used: clinical judgement Functional Limitation: Mobility: Walking and moving around Mobility: Walking and Moving Around Current Status (Y7829(G8978): At least 1 percent but less than 20 percent impaired, limited or restricted Mobility: Walking and Moving Around Goal Status [phone](G8979): At least 1 percent but less than 20 percent impaired, limited or restricted Mobility: Walking and Moving Around Discharge Status [phone](G8980): At least 1 percent but less than 20 percent impaired, limited or restricted   Rebeca AlertJannie Amarien Carne, MPT Pager: [phone]

## 2013-04-03 NOTE — Care Management Note (Signed)
    Page 1 of 1   04/03/2013     11:07:27 AM   CARE MANAGEMENT NOTE 04/03/2013  Patient:  Glen Jacobson,Glen Jacobson   Account Number:  [id number]  Date Initiated:  04/03/2013  Documentation initiated by:  Lorenda IshiharaPEELE,Gordie Belvin  Subjective/Objective Assessment:   38 yo male admitted s/p Decompression of  L4-L5, Microdiskectomy at L4-5. PTA lived at home with spouse.     Action/Plan:   Home when stable   Anticipated DC Date:  04/03/2013   Anticipated DC Plan:  HOME/SELF CARE      DC Planning Services  CM consult      Choice offered to / List presented to:             Status of service:  Completed, signed off Medicare Important Message given?   (If response is "NO", the following Medicare IM given date fields will be blank) Date Medicare IM given:   Date Additional Medicare IM given:    Discharge Disposition:  HOME/SELF CARE  Per UR Regulation:  Reviewed for med. necessity/level of care/duration of stay  If discussed at Long Length of Stay Meetings, dates discussed:    Comments:  04-03-13 Lorenda IshiharaSuzanne Mihcael Ledee RN CM 1100 Spoke with bedside RN regarding orders for DME Glucometer, script should be written and patient can fill at a pharmacy of his choice.

## 2013-04-03 NOTE — Discharge Instructions (Signed)
Change your dressing daily. °Shower only, no tub bath. °Call if any temperatures greater than 101 or any wound complications: 545-5000 during the day and ask for Dr. Gioffre's nurse, Tammy Johnson. °

## 2013-04-03 NOTE — Progress Notes (Signed)
Subjective: 1 Day Post-Op Procedure(s) (LRB): HEMI-MICRODISCECTOMY LUMBAR LAMINECTOMY L4-L5 ON LEFT (Left) Patient reports pain as 2 on 0-10 scale.  Doing well today. Neuro intact.  Objective: Vital signs in last 24 hours: Temp:  [97.4 F (36.3 C)-98.6 F (37 C)] 98 F (36.7 C) (01/29 0546) Pulse Rate:  [52-98] 64 (01/29 0546) Resp:  [11-20] 16 (01/29 0546) BP: (84-137)/(45-88) 128/88 mmHg (01/29 0546) SpO2:  [92 %-100 %] 93 % (01/29 0546) Weight:  [111.585 kg (246 lb)] 111.585 kg (246 lb) (01/28 1400)  Intake/Output from previous day: 01/28 0701 - 01/29 0700 In: 4133.3 [P.O.:820; I.V.:3163.3; IV Piggyback:150] Out: 2675 [Urine:2675] Intake/Output this shift:     Recent Labs  03/31/13 0945  HGB 14.4    Recent Labs  03/31/13 0945  WBC 11.1*  RBC 4.48  HCT 41.5  PLT 391    Recent Labs  03/31/13 0945  NA 136*  K 4.2  CL 95*  CO2 26  BUN 14  CREATININE 0.96  GLUCOSE 109*  CALCIUM 9.7    Recent Labs  03/31/13 0945  INR 0.85    Neurologically intact Dorsiflexion/Plantar flexion intact  Assessment/Plan: 1 Day Post-Op Procedure(s) (LRB): HEMI-MICRODISCECTOMY LUMBAR LAMINECTOMY L4-L5 ON LEFT (Left) Discharge home with home health. Office in two weeks.  Latham Kinzler A 04/03/2013, 7:23 AM

## 2013-04-03 NOTE — Discharge Summary (Signed)
Physician Discharge Summary  Patient ID: Glen ReeveJohn G Jacobson MRN: [id number] DOB/AGE: 03/19/1975 38 y.o.  Admit date: 04/02/2013 Discharge date: 04/03/2013  Admission Diagnoses:Spinal Stenosis and Herniated Lumbar Disc.  Discharge Diagnoses: Spinal Stenosis and Herniated Lumbar Disc. Active Problems:   Spinal stenosis, lumbar region, with neurogenic claudication   Herniated lumbar intervertebral disc   Spinal stenosis, lumbar   Discharged Condition: good  Hospital Course: No post-op problems.  Consults: PT and OT  Significant Diagnostic Studies: Xrays in OR  Treatments: antibiotics: Clindamycin  Discharge Exam: Blood pressure 128/88, pulse 64, temperature 98 F (36.7 C), temperature source Oral, resp. rate 16, height 5\' 11"  (1.803 m), weight 111.585 kg (246 lb), SpO2 93.00%. Extremities: extremities normal, atraumatic, no cyanosis or edema and no edema, redness or tenderness in the calves or thighs  Disposition: Final discharge disposition not confirmed  Discharge Orders   Future Orders Complete By Expires   Call MD / Call 911  As directed    Comments:     If you experience chest pain or shortness of breath, CALL 911 and be transported to the hospital emergency room.  If you develope Jacobson fever above 101 F, pus (white drainage) or increased drainage or redness at the wound, or calf pain, call your surgeon's office.   Constipation Prevention  As directed    Comments:     Drink plenty of fluids.  Prune juice may be helpful.  You may use Jacobson stool softener, such as Colace (over the counter) 100 mg twice Jacobson day.  Use MiraLax (over the counter) for constipation as needed.   Diet Carb Modified  As directed    Discharge instructions  As directed    Comments:     Change your dressing daily. Shower only, no tub bath. Call if any temperatures greater than 101 or any wound complications: [phone] during the day and ask for Dr. Jeannetta Jacobson's nurse, Glen Jacobson.   Driving restrictions  As directed     Comments:     No driving while on pain medications   Increase activity slowly as tolerated  As directed    Lifting restrictions  As directed    Comments:     No lifting       Medication List         alprazolam 2 MG tablet  Commonly known as:  XANAX  Take 2 mg by mouth daily after supper.     hydrochlorothiazide 12.5 MG tablet  Commonly known as:  HYDRODIURIL  Take 12.5 mg by mouth every morning.     levothyroxine 50 MCG tablet  Commonly known as:  SYNTHROID, LEVOTHROID  Take 50 mcg by mouth daily before breakfast.     lisinopril-hydrochlorothiazide 20-25 MG per tablet  Commonly known as:  PRINZIDE,ZESTORETIC  Take 1 tablet by mouth every morning.     metFORMIN 500 MG tablet  Commonly known as:  GLUCOPHAGE  Take 500 mg by mouth daily with breakfast. NEW PRESCRIPTION FOR PT - PRESCRIBED TO TAKE BID BUT PT ONLY TAKING ONCE Jacobson DAY - CAUSING NAUSEA AND WEAKNESS IF HE TAKES TWICE Jacobson DAY     methocarbamol 500 MG tablet  Commonly known as:  ROBAXIN  Take 1 tablet (500 mg total) by mouth 3 (three) times daily as needed for muscle spasms.     oxyCODONE-acetaminophen 10-325 MG per tablet  Commonly known as:  PERCOCET  Take 1 tablet by mouth every 4 (four) hours as needed for pain.     simvastatin 20 MG  tablet  Commonly known as:  ZOCOR  Take 20 mg by mouth every morning.     Vitamin D (Ergocalciferol) 50000 UNITS Caps capsule  Commonly known as:  DRISDOL  Take 50,000 Units by mouth every Friday.           Follow-up Information   Follow up with Glen Cavenaugh A, MD. Schedule an appointment as soon as possible for Jacobson visit in 2 weeks.   Specialty:  Orthopedic Surgery   Contact information:   [address] [place] [address] [phone]       Signed: Jacki Cones 04/03/2013, 7:25 AM

## 2013-04-03 NOTE — Progress Notes (Signed)
04/03/13 0830 Patient states he has not had much education on diabetes.  Patient request glucose meter stating he does not have one.  Will have patient watch diabetes videos and ordered living well diabetes booklet per Diabetic coordinators suggestions. To help with some education for patient.

## 2013-05-29 ENCOUNTER — Encounter (HOSPITAL_COMMUNITY): Payer: Self-pay | Admitting: Psychiatry

## 2013-05-29 ENCOUNTER — Ambulatory Visit (INDEPENDENT_AMBULATORY_CARE_PROVIDER_SITE_OTHER): Payer: BC Managed Care – PPO | Admitting: Psychiatry

## 2013-05-29 VITALS — BP 90/60 | Ht 71.0 in | Wt 246.0 lb

## 2013-05-29 DIAGNOSIS — F3160 Bipolar disorder, current episode mixed, unspecified: Secondary | ICD-10-CM

## 2013-05-29 DIAGNOSIS — F3162 Bipolar disorder, current episode mixed, moderate: Secondary | ICD-10-CM | POA: Insufficient documentation

## 2013-05-29 MED ORDER — LITHIUM CARBONATE 300 MG PO TABS: ORAL_TABLET | ORAL | Status: DC
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
## 2013-05-29 NOTE — Patient Instructions (Signed)
Do lab work in the am after 2 weeks

## 2013-06-11 LAB — LITHIUM LEVEL: Lithium Lvl: 1 mEq/L (ref 0.80–1.40)

## 2013-06-19 ENCOUNTER — Ambulatory Visit (HOSPITAL_COMMUNITY): Payer: Self-pay | Admitting: Psychiatry

## 2013-06-26 ENCOUNTER — Encounter (HOSPITAL_COMMUNITY): Payer: Self-pay | Admitting: Psychiatry

## 2013-06-26 ENCOUNTER — Ambulatory Visit (INDEPENDENT_AMBULATORY_CARE_PROVIDER_SITE_OTHER): Payer: BC Managed Care – PPO | Admitting: Psychiatry

## 2013-06-26 VITALS — BP 110/70 | Ht 71.0 in | Wt 245.0 lb

## 2013-06-26 DIAGNOSIS — F3162 Bipolar disorder, current episode mixed, moderate: Secondary | ICD-10-CM

## 2013-06-26 DIAGNOSIS — F3160 Bipolar disorder, current episode mixed, unspecified: Secondary | ICD-10-CM

## 2013-06-26 MED ORDER — LITHIUM CARBONATE 300 MG PO TABS: ORAL_TABLET | ORAL | Status: DC

## 2013-06-26 MED ORDER — TRAZODONE HCL 100 MG PO TABS: 100.0000 mg | ORAL_TABLET | Freq: Every day | ORAL | Status: DC
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
## 2013-06-27 ENCOUNTER — Ambulatory Visit (HOSPITAL_COMMUNITY): Payer: Self-pay | Admitting: Psychology

## 2013-06-27 ENCOUNTER — Telehealth (HOSPITAL_COMMUNITY): Payer: Self-pay | Admitting: *Deleted

## 2013-06-27 NOTE — Telephone Encounter (Signed)
noted 

## 2013-07-15 ENCOUNTER — Ambulatory Visit (HOSPITAL_COMMUNITY): Payer: Self-pay | Admitting: Psychology

## 2013-07-24 ENCOUNTER — Telehealth (HOSPITAL_COMMUNITY): Payer: Self-pay | Admitting: *Deleted

## 2013-07-24 ENCOUNTER — Telehealth (HOSPITAL_COMMUNITY): Payer: Self-pay
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
## 2013-07-24 NOTE — Telephone Encounter (Signed)
Noted. Lithium level here was 1.0

## 2013-08-01 ENCOUNTER — Ambulatory Visit (INDEPENDENT_AMBULATORY_CARE_PROVIDER_SITE_OTHER): Payer: BC Managed Care – PPO | Admitting: Psychiatry

## 2013-08-01 ENCOUNTER — Encounter (HOSPITAL_COMMUNITY): Payer: Self-pay | Admitting: Psychiatry

## 2013-08-01 VITALS — BP 110/70 | Ht 71.0 in | Wt 239.0 lb

## 2013-08-01 DIAGNOSIS — F3162 Bipolar disorder, current episode mixed, moderate: Secondary | ICD-10-CM

## 2013-08-01 DIAGNOSIS — F3160 Bipolar disorder, current episode mixed, unspecified: Secondary | ICD-10-CM

## 2013-08-01 MED ORDER — LAMOTRIGINE 25 MG PO TABS: ORAL_TABLET | ORAL | Status: DC

## 2013-08-01 MED ORDER — TRAZODONE HCL 100 MG PO TABS: 100.0000 mg | ORAL_TABLET | Freq: Every day | ORAL | Status: DC
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
## 2013-08-07 ENCOUNTER — Ambulatory Visit (HOSPITAL_COMMUNITY): Payer: Self-pay | Admitting: Psychiatry

## 2013-08-29 ENCOUNTER — Ambulatory Visit (HOSPITAL_COMMUNITY): Payer: Self-pay | Admitting: Psychiatry

## 2013-08-29 ENCOUNTER — Encounter (HOSPITAL_COMMUNITY): Payer: Self-pay | Admitting: Psychiatry

## 2013-08-29 ENCOUNTER — Ambulatory Visit (INDEPENDENT_AMBULATORY_CARE_PROVIDER_SITE_OTHER): Payer: BC Managed Care – PPO | Admitting: Psychiatry

## 2013-08-29 VITALS — BP 110/70 | Ht 71.0 in | Wt 236.0 lb

## 2013-08-29 DIAGNOSIS — F3162 Bipolar disorder, current episode mixed, moderate: Secondary | ICD-10-CM

## 2013-08-29 DIAGNOSIS — F3160 Bipolar disorder, current episode mixed, unspecified: Secondary | ICD-10-CM

## 2013-08-29 MED ORDER — TRAZODONE HCL 100 MG PO TABS: 100.0000 mg | ORAL_TABLET | Freq: Every day | ORAL | Status: DC

## 2013-08-29 MED ORDER — LAMOTRIGINE 100 MG PO TABS: 100.0000 mg | ORAL_TABLET | Freq: Two times a day (BID) | ORAL | Status: DC
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
## 2013-09-25 ENCOUNTER — Ambulatory Visit (HOSPITAL_COMMUNITY): Payer: Self-pay | Admitting: Psychiatry

## 2013-11-07 ENCOUNTER — Ambulatory Visit (INDEPENDENT_AMBULATORY_CARE_PROVIDER_SITE_OTHER): Payer: BC Managed Care – PPO | Admitting: Psychiatry

## 2013-11-07 ENCOUNTER — Encounter (HOSPITAL_COMMUNITY): Payer: Self-pay | Admitting: Psychiatry

## 2013-11-07 ENCOUNTER — Other Ambulatory Visit (HOSPITAL_COMMUNITY): Payer: Self-pay | Admitting: Physician Assistant

## 2013-11-07 VITALS — BP 113/77 | HR 90 | Ht 71.0 in | Wt 247.2 lb

## 2013-11-07 DIAGNOSIS — F3160 Bipolar disorder, current episode mixed, unspecified: Secondary | ICD-10-CM

## 2013-11-07 DIAGNOSIS — R109 Unspecified abdominal pain: Secondary | ICD-10-CM

## 2013-11-07 DIAGNOSIS — F3162 Bipolar disorder, current episode mixed, moderate: Secondary | ICD-10-CM

## 2013-11-07 MED ORDER — LAMOTRIGINE 100 MG PO TABS: 100.0000 mg | ORAL_TABLET | Freq: Two times a day (BID) | ORAL | Status: DC

## 2013-11-07 MED ORDER — QUETIAPINE FUMARATE 100 MG PO TABS: ORAL_TABLET | ORAL | Status: DC

## 2013-11-07 MED ORDER — TRAZODONE HCL 100 MG PO TABS: 100.0000 mg | ORAL_TABLET | Freq: Every day | ORAL | Status: DC
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
## 2013-11-14 ENCOUNTER — Ambulatory Visit (HOSPITAL_COMMUNITY): Payer: BC Managed Care – PPO

## 2013-11-21 ENCOUNTER — Ambulatory Visit (HOSPITAL_COMMUNITY): Payer: BC Managed Care – PPO

## 2013-12-05 ENCOUNTER — Encounter (HOSPITAL_COMMUNITY): Payer: Self-pay | Admitting: Psychiatry

## 2013-12-05 ENCOUNTER — Ambulatory Visit (HOSPITAL_COMMUNITY): Payer: Self-pay | Admitting: Psychiatry

## 2014-02-13 ENCOUNTER — Other Ambulatory Visit (HOSPITAL_COMMUNITY): Payer: Self-pay | Admitting: Psychiatry

## 2014-02-19 ENCOUNTER — Telehealth (HOSPITAL_COMMUNITY): Payer: Self-pay | Admitting: *Deleted

## 2014-02-19 NOTE — Telephone Encounter (Signed)
Pt pharmacy requesting refills for pt Glen Jacobson 100 mg BID. Pt medication was last filled 11-07-13 with 60 tablets 2 refills. Pt was scheduled to come in 12-05-13 and he cancelled his appt. Pt do not have f/u appt. Pt f/u appt is scheduled for Mar 30, 2014.

## 2014-02-19 NOTE — Telephone Encounter (Signed)
You may send in 30 days with one refill

## 2014-02-20 ENCOUNTER — Other Ambulatory Visit (HOSPITAL_COMMUNITY): Payer: Self-pay | Admitting: *Deleted

## 2014-02-20 ENCOUNTER — Telehealth (HOSPITAL_COMMUNITY): Payer: Self-pay | Admitting: *Deleted

## 2014-02-20 MED ORDER — LAMOTRIGINE 100 MG PO TABS: 100.0000 mg | ORAL_TABLET | Freq: Two times a day (BID) | ORAL | Status: DC

## 2014-02-20 NOTE — Telephone Encounter (Signed)
Medication sent to pharmacy  

## 2014-02-20 NOTE — Telephone Encounter (Signed)
Per Dr. Tenny Crawoss to go ahead and fill 30 days supply for pt. With 1 refill

## 2014-02-20 NOTE — Telephone Encounter (Signed)
Opened in Error.

## 2014-03-14 ENCOUNTER — Other Ambulatory Visit (HOSPITAL_COMMUNITY): Payer: Self-pay | Admitting: Psychiatry

## 2014-03-17 ENCOUNTER — Telehealth (HOSPITAL_COMMUNITY): Payer: Self-pay | Admitting: *Deleted

## 2014-03-17 ENCOUNTER — Other Ambulatory Visit (HOSPITAL_COMMUNITY): Payer: Self-pay | Admitting: *Deleted

## 2014-03-17 MED ORDER — QUETIAPINE FUMARATE 100 MG PO TABS: ORAL_TABLET | ORAL | Status: DC

## 2014-03-17 NOTE — Telephone Encounter (Signed)
Pt called stating he only have 1 tablets felt of his Seroquel 100 mg BID. 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 Pt medication was last filled 11-07-13 with 60 tablets 2 refills. Pt No Showed for his last appt 12-05-13 but was rescheduled for 03-30-14. Pt phone number is [phone].

## 2014-03-17 NOTE — Telephone Encounter (Signed)
Per Dr. Tenny Crawoss to send in 30 days supply to pharmacy per pt request

## 2014-03-17 NOTE — Telephone Encounter (Signed)
Send in 30 day supply to pharmacy

## 2014-03-17 NOTE — Telephone Encounter (Signed)
Pt is aware of medication being sent to pharmacy

## 2014-03-30 ENCOUNTER — Encounter (HOSPITAL_COMMUNITY): Payer: Self-pay | Admitting: Psychiatry

## 2014-03-30 ENCOUNTER — Ambulatory Visit (INDEPENDENT_AMBULATORY_CARE_PROVIDER_SITE_OTHER): Payer: BLUE CROSS/BLUE SHIELD | Admitting: Psychiatry

## 2014-03-30 VITALS — BP 115/64 | HR 85 | Ht 71.0 in | Wt 263.0 lb

## 2014-03-30 DIAGNOSIS — F3162 Bipolar disorder, current episode mixed, moderate: Secondary | ICD-10-CM

## 2014-03-30 DIAGNOSIS — F3160 Bipolar disorder, current episode mixed, unspecified: Secondary | ICD-10-CM

## 2014-03-30 MED ORDER — QUETIAPINE FUMARATE 100 MG PO TABS: ORAL_TABLET | ORAL | Status: DC

## 2014-03-30 MED ORDER — ALPRAZOLAM 1 MG PO TABS: 1.0000 mg | ORAL_TABLET | Freq: Three times a day (TID) | ORAL | Status: DC | PRN

## 2014-03-30 MED ORDER — LAMOTRIGINE 100 MG PO TABS: 100.0000 mg | ORAL_TABLET | Freq: Three times a day (TID) | ORAL | Status: DC
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
## 2014-04-03 ENCOUNTER — Ambulatory Visit (HOSPITAL_COMMUNITY): Payer: Self-pay | Admitting: Psychiatry

## 2014-07-24 ENCOUNTER — Encounter (HOSPITAL_COMMUNITY): Payer: Self-pay | Admitting: Psychiatry

## 2014-07-24 ENCOUNTER — Ambulatory Visit (INDEPENDENT_AMBULATORY_CARE_PROVIDER_SITE_OTHER): Payer: BLUE CROSS/BLUE SHIELD | Admitting: Psychiatry

## 2014-07-24 VITALS — BP 100/80 | Ht 71.0 in | Wt 253.0 lb

## 2014-07-24 DIAGNOSIS — F902 Attention-deficit hyperactivity disorder, combined type: Secondary | ICD-10-CM | POA: Diagnosis not present

## 2014-07-24 DIAGNOSIS — F3162 Bipolar disorder, current episode mixed, moderate: Secondary | ICD-10-CM

## 2014-07-24 MED ORDER — QUETIAPINE FUMARATE 100 MG PO TABS: 100.0000 mg | ORAL_TABLET | Freq: Every day | ORAL | Status: DC

## 2014-07-24 MED ORDER — LISDEXAMFETAMINE DIMESYLATE 40 MG PO CAPS: 40.0000 mg | ORAL_CAPSULE | ORAL | Status: DC

## 2014-07-24 MED ORDER — ALPRAZOLAM 1 MG PO TABS: 1.0000 mg | ORAL_TABLET | Freq: Three times a day (TID) | ORAL | Status: DC | PRN
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
## 2014-07-27 ENCOUNTER — Telehealth (HOSPITAL_COMMUNITY): Payer: Self-pay | Admitting: *Deleted

## 2014-07-27 NOTE — Telephone Encounter (Signed)
voice message from patient, he need pre-authorization for his meds at [facility].

## 2014-07-28 ENCOUNTER — Telehealth (HOSPITAL_COMMUNITY): Payer: Self-pay | Admitting: *Deleted

## 2014-07-28 NOTE — Telephone Encounter (Signed)
Called pt and informed him that his Vyvanse script will be resent to office for office to start Prior Auth and will call him back to inform him when it goes through. Pt agreed and will calling back in 2-3 days to make sure Prior Auth went through.

## 2014-07-28 NOTE — Telephone Encounter (Signed)
Prior authorization received for Vyvanse 40mg . Called and was told to submit online. Submitted online with cover my meds. Awaiting response.

## 2014-07-30 NOTE — Telephone Encounter (Signed)
Informed pt of what Dr. Tenny Crawoss stated and he agreed to still come to his appt.

## 2014-07-30 NOTE — Telephone Encounter (Signed)
[employer] just faxed office a denial letter for pt Vyvanse. Denial letter will be faxed to [facility], [employer] to start it. Informed pt and he stated that he have not been taking medication due to waiting for [employer] to approve. Per pt do he need to extend his appt to another day due to that visit being a f/u from how the [employer] is working? Pt number is [phone].

## 2014-07-30 NOTE — Telephone Encounter (Signed)
No, leave appt as is

## 2014-08-04 ENCOUNTER — Telehealth (HOSPITAL_COMMUNITY): Payer: Self-pay | Admitting: *Deleted

## 2014-08-04 NOTE — Telephone Encounter (Signed)
lmtcb

## 2014-08-04 NOTE — Telephone Encounter (Signed)
phone call from patient ADHD medication, insurance said until he tries another medicine they will not approve it.

## 2014-08-05 ENCOUNTER — Telehealth (HOSPITAL_COMMUNITY): Payer: Self-pay | Admitting: *Deleted

## 2014-08-05 ENCOUNTER — Other Ambulatory Visit (HOSPITAL_COMMUNITY): Payer: Self-pay | Admitting: Psychiatry

## 2014-08-05 MED ORDER — AMPHETAMINE-DEXTROAMPHET ER 30 MG PO CP24: 30.0000 mg | ORAL_CAPSULE | Freq: Every day | ORAL | Status: DC

## 2014-08-05 NOTE — Telephone Encounter (Signed)
Per Shirlean KellyAliI, she need to send Dr. Tenny Crawoss a message about pt medication because it was denied due to not trying Adderal XR, Focalin xR, or Ritalin [place]. So I can appeal but it will not be approved without any other info. Pt is on Vyvanse and would like to know if he could try something else that is need for the insurance to pay due to his pay being $200.00 without the insurance. Pt number is [phone]

## 2014-08-05 NOTE — Telephone Encounter (Signed)
Prior authorization for Vyvanse was denied due to not having tried and failed Adderall XR, Focalin XR, or Ritalin [place]. Will ask MD if they would like to try a different medication or provide more information for appeal.

## 2014-08-05 NOTE — Telephone Encounter (Signed)
Adderall XR 30 mg printed

## 2014-08-05 NOTE — Telephone Encounter (Signed)
I printed a script for Adderall XR

## 2014-08-05 NOTE — Telephone Encounter (Signed)
Pt is aware script is ready for pickup and pt agreed

## 2014-08-10 ENCOUNTER — Telehealth (HOSPITAL_COMMUNITY): Payer: Self-pay | Admitting: *Deleted

## 2014-08-10 NOTE — Telephone Encounter (Signed)
Pt called and lm on voicemail August 10, 2014 at 8:03am stating that he will not be able to come to appt due to his work. 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 Per pt he will discuss this with Dr. Tenny Crawoss on his next office visit. Pt number is [phone]. Called pt and left message stating name and returning phone call and to call back. Number provided.

## 2014-08-21 ENCOUNTER — Encounter (HOSPITAL_COMMUNITY): Payer: Self-pay | Admitting: Psychiatry

## 2014-08-21 ENCOUNTER — Ambulatory Visit (INDEPENDENT_AMBULATORY_CARE_PROVIDER_SITE_OTHER): Payer: BLUE CROSS/BLUE SHIELD | Admitting: Psychiatry

## 2014-08-21 VITALS — BP 133/87 | HR 111 | Ht 71.0 in | Wt 280.0 lb

## 2014-08-21 DIAGNOSIS — F3160 Bipolar disorder, current episode mixed, unspecified: Secondary | ICD-10-CM

## 2014-08-21 DIAGNOSIS — F902 Attention-deficit hyperactivity disorder, combined type: Secondary | ICD-10-CM

## 2014-08-21 MED ORDER — AMPHETAMINE-DEXTROAMPHET ER 30 MG PO CP24: 30.0000 mg | ORAL_CAPSULE | Freq: Every day | ORAL | Status: DC

## 2014-08-21 MED ORDER — QUETIAPINE FUMARATE 100 MG PO TABS: 100.0000 mg | ORAL_TABLET | Freq: Every day | ORAL | Status: DC
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
## 2014-08-27 ENCOUNTER — Telehealth (HOSPITAL_COMMUNITY): Payer: Self-pay | Admitting: *Deleted

## 2014-09-11 ENCOUNTER — Ambulatory Visit (INDEPENDENT_AMBULATORY_CARE_PROVIDER_SITE_OTHER): Payer: BLUE CROSS/BLUE SHIELD | Admitting: Psychiatry

## 2014-09-11 ENCOUNTER — Encounter (HOSPITAL_COMMUNITY): Payer: Self-pay | Admitting: Psychiatry

## 2014-09-11 VITALS — BP 122/76 | HR 127 | Ht 71.0 in | Wt 266.2 lb

## 2014-09-11 DIAGNOSIS — F3160 Bipolar disorder, current episode mixed, unspecified: Secondary | ICD-10-CM

## 2014-09-11 DIAGNOSIS — F902 Attention-deficit hyperactivity disorder, combined type: Secondary | ICD-10-CM

## 2014-09-11 MED ORDER — AMPHETAMINE-DEXTROAMPHET ER 30 MG PO CP24: 30.0000 mg | ORAL_CAPSULE | Freq: Every day | ORAL | Status: DC

## 2014-09-11 MED ORDER — LAMOTRIGINE 100 MG PO TABS: 200.0000 mg | ORAL_TABLET | Freq: Every day | ORAL | Status: DC

## 2014-09-11 MED ORDER — QUETIAPINE FUMARATE 100 MG PO TABS: 100.0000 mg | ORAL_TABLET | Freq: Every day | ORAL | Status: DC
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
## 2014-09-18 ENCOUNTER — Ambulatory Visit (HOSPITAL_COMMUNITY): Payer: Self-pay | Admitting: Psychiatry

## 2014-12-07 ENCOUNTER — Ambulatory Visit (HOSPITAL_COMMUNITY): Payer: Self-pay | Admitting: Psychiatry

## 2014-12-14 ENCOUNTER — Ambulatory Visit (INDEPENDENT_AMBULATORY_CARE_PROVIDER_SITE_OTHER): Payer: BLUE CROSS/BLUE SHIELD | Admitting: Psychiatry

## 2014-12-14 ENCOUNTER — Encounter (HOSPITAL_COMMUNITY): Payer: Self-pay | Admitting: Psychiatry

## 2014-12-14 VITALS — BP 103/61 | HR 68 | Ht 71.0 in | Wt 259.4 lb

## 2014-12-14 DIAGNOSIS — F3162 Bipolar disorder, current episode mixed, moderate: Secondary | ICD-10-CM | POA: Diagnosis not present

## 2014-12-14 DIAGNOSIS — F902 Attention-deficit hyperactivity disorder, combined type: Secondary | ICD-10-CM | POA: Diagnosis not present

## 2014-12-14 MED ORDER — LAMOTRIGINE 100 MG PO TABS: 200.0000 mg | ORAL_TABLET | Freq: Every day | ORAL | Status: DC

## 2014-12-14 MED ORDER — ALPRAZOLAM 1 MG PO TABS
1.0000 mg | ORAL_TABLET | Freq: Three times a day (TID) | ORAL | Status: DC | PRN
Start: 2014-12-14 — End: 2015-03-22

## 2014-12-14 MED ORDER — QUETIAPINE FUMARATE 100 MG PO TABS: 100.0000 mg | ORAL_TABLET | Freq: Every day | ORAL | Status: DC

## 2014-12-14 MED ORDER — AMPHETAMINE-DEXTROAMPHET ER 30 MG PO CP24: 30.0000 mg | ORAL_CAPSULE | Freq: Every day | ORAL | Status: DC
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
## 2015-03-08 ENCOUNTER — Ambulatory Visit: Payer: Self-pay | Admitting: "Endocrinology

## 2015-03-15 ENCOUNTER — Ambulatory Visit (HOSPITAL_COMMUNITY): Payer: Self-pay | Admitting: Psychiatry

## 2015-03-15 ENCOUNTER — Ambulatory Visit: Payer: Self-pay | Admitting: "Endocrinology

## 2015-03-22 ENCOUNTER — Ambulatory Visit (INDEPENDENT_AMBULATORY_CARE_PROVIDER_SITE_OTHER): Payer: BLUE CROSS/BLUE SHIELD | Admitting: Psychiatry

## 2015-03-22 ENCOUNTER — Encounter (HOSPITAL_COMMUNITY): Payer: Self-pay | Admitting: Psychiatry

## 2015-03-22 ENCOUNTER — Other Ambulatory Visit (HOSPITAL_COMMUNITY): Payer: Self-pay | Admitting: Psychiatry

## 2015-03-22 ENCOUNTER — Other Ambulatory Visit: Payer: Self-pay | Admitting: "Endocrinology

## 2015-03-22 VITALS — BP 108/73 | HR 83 | Ht 71.0 in | Wt 257.0 lb

## 2015-03-22 DIAGNOSIS — F3162 Bipolar disorder, current episode mixed, moderate: Secondary | ICD-10-CM

## 2015-03-22 DIAGNOSIS — F902 Attention-deficit hyperactivity disorder, combined type: Secondary | ICD-10-CM

## 2015-03-22 MED ORDER — AMPHETAMINE-DEXTROAMPHET ER 30 MG PO CP24: 30.0000 mg | ORAL_CAPSULE | Freq: Every day | ORAL | Status: DC

## 2015-03-22 MED ORDER — LAMOTRIGINE 200 MG PO TABS: 200.0000 mg | ORAL_TABLET | Freq: Every day | ORAL | Status: DC

## 2015-03-22 MED ORDER — QUETIAPINE FUMARATE 100 MG PO TABS: 100.0000 mg | ORAL_TABLET | Freq: Every day | ORAL | Status: DC

## 2015-03-22 MED ORDER — ALPRAZOLAM 1 MG PO TABS
1.0000 mg | ORAL_TABLET | Freq: Three times a day (TID) | ORAL | Status: DC | PRN
Start: 2015-03-22 — End: 2015-05-03
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
## 2015-03-23 LAB — COMPREHENSIVE METABOLIC PANEL
ALT: 35 IU/L (ref 0–44)
AST: 23 IU/L (ref 0–40)
Albumin/Globulin Ratio: 1.9 (ref 1.1–2.5)
Albumin: 4.6 g/dL (ref 3.5–5.5)
Alkaline Phosphatase: 81 IU/L (ref 39–117)
BUN/Creatinine Ratio: 10 (ref 9–20)
BUN: 11 mg/dL (ref 6–24)
Bilirubin Total: 0.3 mg/dL (ref 0.0–1.2)
CALCIUM: 9.9 mg/dL (ref 8.7–10.2)
CO2: 26 mmol/L (ref 18–29)
CREATININE: 1.05 mg/dL (ref 0.76–1.27)
Chloride: 99 mmol/L (ref 96–106)
GFR calc Af Amer: 102 mL/min/{1.73_m2} (ref >59)
GFR, EST NON AFRICAN AMERICAN: 88 mL/min/{1.73_m2} (ref >59)
Globulin, Total: 2.4 g/dL (ref 1.5–4.5)
Glucose: 108 mg/dL — ABNORMAL HIGH (ref 65–99)
Potassium: 4.8 mmol/L (ref 3.5–5.2)
Sodium: 137 mmol/L (ref 134–144)
Total Protein: 7 g/dL (ref 6.0–8.5)

## 2015-03-23 LAB — HGB A1C W/O EAG: Hgb A1c MFr Bld: 6 % — ABNORMAL HIGH (ref 4.8–5.6)

## 2015-03-23 LAB — TSH: TSH: 2.42 u[IU]/mL (ref 0.450–4.500)

## 2015-03-23 LAB — T4, FREE: FREE T4: 1.27 ng/dL (ref 0.82–1.77)

## 2015-03-23 LAB — VITAMIN D 25 HYDROXY (VIT D DEFICIENCY, FRACTURES): Vit D, 25-Hydroxy: 18.2 ng/mL — ABNORMAL LOW (ref 30.0–100.0)

## 2015-03-29 ENCOUNTER — Ambulatory Visit (INDEPENDENT_AMBULATORY_CARE_PROVIDER_SITE_OTHER): Payer: BLUE CROSS/BLUE SHIELD | Admitting: "Endocrinology

## 2015-03-29 ENCOUNTER — Encounter: Payer: Self-pay | Admitting: "Endocrinology

## 2015-03-29 VITALS — BP 132/84 | HR 117 | Ht 71.0 in | Wt 256.0 lb

## 2015-03-29 DIAGNOSIS — R7303 Prediabetes: Secondary | ICD-10-CM

## 2015-03-29 DIAGNOSIS — E038 Other specified hypothyroidism: Secondary | ICD-10-CM | POA: Diagnosis not present

## 2015-03-29 DIAGNOSIS — E785 Hyperlipidemia, unspecified: Secondary | ICD-10-CM

## 2015-03-29 DIAGNOSIS — E559 Vitamin D deficiency, unspecified: Secondary | ICD-10-CM | POA: Diagnosis not present

## 2015-03-29 DIAGNOSIS — E782 Mixed hyperlipidemia: Secondary | ICD-10-CM | POA: Insufficient documentation

## 2015-03-29 MED ORDER — VITAMIN D (ERGOCALCIFEROL) 1.25 MG (50000 UNIT) PO CAPS: 50000.0000 [IU] | ORAL_CAPSULE | ORAL | Status: DC

## 2015-03-29 MED ORDER — ATORVASTATIN CALCIUM 40 MG PO TABS: 40.0000 mg | ORAL_TABLET | Freq: Every day | ORAL | Status: DC

## 2015-03-29 MED ORDER — LEVOTHYROXINE SODIUM 88 MCG PO TABS: 88.0000 ug | ORAL_TABLET | Freq: Every day | ORAL | Status: DC

## 2015-03-29 MED ORDER — METFORMIN HCL 500 MG PO TABS: 500.0000 mg | ORAL_TABLET | Freq: Two times a day (BID) | ORAL | Status: DC
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
## 2015-05-03 ENCOUNTER — Encounter (HOSPITAL_COMMUNITY): Payer: Self-pay | Admitting: Psychiatry

## 2015-05-03 ENCOUNTER — Ambulatory Visit (INDEPENDENT_AMBULATORY_CARE_PROVIDER_SITE_OTHER): Payer: BLUE CROSS/BLUE SHIELD | Admitting: Psychiatry

## 2015-05-03 VITALS — BP 113/69 | HR 100 | Ht 71.0 in | Wt 255.2 lb

## 2015-05-03 DIAGNOSIS — F3162 Bipolar disorder, current episode mixed, moderate: Secondary | ICD-10-CM

## 2015-05-03 DIAGNOSIS — F902 Attention-deficit hyperactivity disorder, combined type: Secondary | ICD-10-CM | POA: Diagnosis not present

## 2015-05-03 MED ORDER — AMPHETAMINE-DEXTROAMPHET ER 30 MG PO CP24: 30.0000 mg | ORAL_CAPSULE | Freq: Every day | ORAL | Status: DC

## 2015-05-03 MED ORDER — QUETIAPINE FUMARATE 100 MG PO TABS: 100.0000 mg | ORAL_TABLET | Freq: Every day | ORAL | Status: DC

## 2015-05-03 MED ORDER — ALPRAZOLAM 1 MG PO TABS: 1.0000 mg | ORAL_TABLET | Freq: Three times a day (TID) | ORAL | Status: DC | PRN
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
## 2015-05-31 ENCOUNTER — Ambulatory Visit (HOSPITAL_COMMUNITY): Payer: Self-pay | Admitting: Psychiatry

## 2015-05-31 ENCOUNTER — Encounter (HOSPITAL_COMMUNITY): Payer: Self-pay | Admitting: Psychiatry

## 2015-06-11 ENCOUNTER — Ambulatory Visit (INDEPENDENT_AMBULATORY_CARE_PROVIDER_SITE_OTHER): Payer: BLUE CROSS/BLUE SHIELD | Admitting: Psychiatry

## 2015-06-11 ENCOUNTER — Encounter (HOSPITAL_COMMUNITY): Payer: Self-pay | Admitting: Psychiatry

## 2015-06-11 VITALS — BP 98/61 | HR 89 | Ht 71.0 in | Wt 260.8 lb

## 2015-06-11 DIAGNOSIS — F902 Attention-deficit hyperactivity disorder, combined type: Secondary | ICD-10-CM | POA: Diagnosis not present

## 2015-06-11 DIAGNOSIS — F3162 Bipolar disorder, current episode mixed, moderate: Secondary | ICD-10-CM | POA: Diagnosis not present

## 2015-06-11 MED ORDER — AMPHETAMINE-DEXTROAMPHET ER 30 MG PO CP24: 30.0000 mg | ORAL_CAPSULE | Freq: Every day | ORAL | Status: DC

## 2015-06-11 MED ORDER — ALPRAZOLAM 1 MG PO TABS: 1.0000 mg | ORAL_TABLET | Freq: Three times a day (TID) | ORAL | Status: DC | PRN

## 2015-06-11 MED ORDER — QUETIAPINE FUMARATE 100 MG PO TABS: 100.0000 mg | ORAL_TABLET | Freq: Every day | ORAL | Status: DC

## 2015-06-11 MED ORDER — LURASIDONE HCL 80 MG PO TABS: 80.0000 mg | ORAL_TABLET | Freq: Every day | ORAL | Status: DC
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
## 2015-07-23 ENCOUNTER — Telehealth (HOSPITAL_COMMUNITY): Payer: Self-pay | Admitting: *Deleted

## 2015-07-23 NOTE — Telephone Encounter (Signed)
lmtcb due to needing to resch appt for July 7th due to office being closed during appt time number provided

## 2015-09-10 ENCOUNTER — Ambulatory Visit (HOSPITAL_COMMUNITY): Payer: Self-pay | Admitting: Psychiatry

## 2015-09-20 ENCOUNTER — Ambulatory Visit (INDEPENDENT_AMBULATORY_CARE_PROVIDER_SITE_OTHER): Payer: 59 | Admitting: Psychiatry

## 2015-09-20 ENCOUNTER — Other Ambulatory Visit: Payer: Self-pay

## 2015-09-20 ENCOUNTER — Encounter (HOSPITAL_COMMUNITY): Payer: Self-pay | Admitting: Psychiatry

## 2015-09-20 ENCOUNTER — Other Ambulatory Visit: Payer: Self-pay | Admitting: "Endocrinology

## 2015-09-20 VITALS — BP 100/72 | Ht 71.0 in | Wt 232.0 lb

## 2015-09-20 DIAGNOSIS — F902 Attention-deficit hyperactivity disorder, combined type: Secondary | ICD-10-CM

## 2015-09-20 DIAGNOSIS — F3162 Bipolar disorder, current episode mixed, moderate: Secondary | ICD-10-CM | POA: Diagnosis not present

## 2015-09-20 DIAGNOSIS — IMO0002 Reserved for concepts with insufficient information to code with codable children: Secondary | ICD-10-CM

## 2015-09-20 DIAGNOSIS — E1165 Type 2 diabetes mellitus with hyperglycemia: Secondary | ICD-10-CM

## 2015-09-20 DIAGNOSIS — E1169 Type 2 diabetes mellitus with other specified complication: Principal | ICD-10-CM

## 2015-09-20 MED ORDER — ALPRAZOLAM 1 MG PO TABS: 1.0000 mg | ORAL_TABLET | Freq: Three times a day (TID) | ORAL | Status: DC | PRN

## 2015-09-20 MED ORDER — QUETIAPINE FUMARATE 100 MG PO TABS: 100.0000 mg | ORAL_TABLET | Freq: Every day | ORAL | Status: DC

## 2015-09-20 MED ORDER — AMPHETAMINE-DEXTROAMPHET ER 30 MG PO CP24: 30.0000 mg | ORAL_CAPSULE | Freq: Every day | ORAL | Status: DC
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
## 2015-09-21 LAB — LIPID PANEL
CHOL/HDL RATIO: 7.4 ratio — AB (ref 0.0–5.0)
Cholesterol, Total: 207 mg/dL — ABNORMAL HIGH (ref 100–199)
HDL: 28 mg/dL — ABNORMAL LOW (ref >39)
LDL Calculated: 135 mg/dL — ABNORMAL HIGH (ref 0–99)
Triglycerides: 221 mg/dL — ABNORMAL HIGH (ref 0–149)
VLDL Cholesterol Cal: 44 mg/dL — ABNORMAL HIGH (ref 5–40)

## 2015-09-21 LAB — TESTOSTERONE: Testosterone: 394 ng/dL (ref 264–916)

## 2015-09-21 LAB — T4, FREE: FREE T4: 1.64 ng/dL (ref 0.82–1.77)

## 2015-09-21 LAB — HEMOGLOBIN A1C
ESTIMATED AVERAGE GLUCOSE: 117 mg/dL
HEMOGLOBIN A1C: 5.7 % — AB (ref 4.8–5.6)

## 2015-09-21 LAB — TSH: TSH: 1.66 u[IU]/mL (ref 0.450–4.500)

## 2015-09-24 ENCOUNTER — Other Ambulatory Visit: Payer: Self-pay | Admitting: "Endocrinology

## 2015-09-27 ENCOUNTER — Ambulatory Visit (INDEPENDENT_AMBULATORY_CARE_PROVIDER_SITE_OTHER): Payer: 59 | Admitting: "Endocrinology

## 2015-09-27 ENCOUNTER — Encounter: Payer: Self-pay | Admitting: "Endocrinology

## 2015-09-27 VITALS — BP 120/80 | HR 120 | Resp 18 | Ht 71.0 in | Wt 234.0 lb

## 2015-09-27 DIAGNOSIS — R7303 Prediabetes: Secondary | ICD-10-CM | POA: Diagnosis not present

## 2015-09-27 DIAGNOSIS — E038 Other specified hypothyroidism: Secondary | ICD-10-CM | POA: Diagnosis not present

## 2015-09-27 DIAGNOSIS — E559 Vitamin D deficiency, unspecified: Secondary | ICD-10-CM

## 2015-09-27 DIAGNOSIS — E785 Hyperlipidemia, unspecified: Secondary | ICD-10-CM | POA: Diagnosis not present

## 2015-09-27 MED ORDER — VITAMIN D (ERGOCALCIFEROL) 1.25 MG (50000 UNIT) PO CAPS: 50000.0000 [IU] | ORAL_CAPSULE | ORAL | 0 refills | Status: DC

## 2015-09-27 MED ORDER — METFORMIN HCL 500 MG PO TABS: 500.0000 mg | ORAL_TABLET | Freq: Two times a day (BID) | ORAL | 1 refills | Status: DC

## 2015-09-27 NOTE — Patient Instructions (Signed)
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
## 2015-12-21 ENCOUNTER — Ambulatory Visit (INDEPENDENT_AMBULATORY_CARE_PROVIDER_SITE_OTHER): Payer: 59 | Admitting: Psychiatry

## 2015-12-21 ENCOUNTER — Encounter (HOSPITAL_COMMUNITY): Payer: Self-pay | Admitting: Psychiatry

## 2015-12-21 VITALS — BP 111/67 | HR 90 | Ht 71.0 in | Wt 232.0 lb

## 2015-12-21 DIAGNOSIS — Z818 Family history of other mental and behavioral disorders: Secondary | ICD-10-CM | POA: Diagnosis not present

## 2015-12-21 DIAGNOSIS — Z811 Family history of alcohol abuse and dependence: Secondary | ICD-10-CM | POA: Diagnosis not present

## 2015-12-21 DIAGNOSIS — F3162 Bipolar disorder, current episode mixed, moderate: Secondary | ICD-10-CM | POA: Diagnosis not present

## 2015-12-21 DIAGNOSIS — F902 Attention-deficit hyperactivity disorder, combined type: Secondary | ICD-10-CM | POA: Diagnosis not present

## 2015-12-21 MED ORDER — DIAZEPAM 10 MG PO TABS: 10.0000 mg | ORAL_TABLET | Freq: Three times a day (TID) | ORAL | 0 refills | Status: DC

## 2015-12-21 MED ORDER — AMPHETAMINE-DEXTROAMPHET ER 30 MG PO CP24: 30.0000 mg | ORAL_CAPSULE | Freq: Every day | ORAL | 0 refills | Status: DC

## 2015-12-21 NOTE — Patient Instructions (Signed)
Take Latuda samples, increase by 20 mg per week
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
## 2016-01-03 ENCOUNTER — Other Ambulatory Visit: Payer: Self-pay | Admitting: "Endocrinology

## 2016-01-04 ENCOUNTER — Ambulatory Visit: Payer: 59 | Admitting: "Endocrinology

## 2016-01-04 LAB — CMP14+EGFR
ALK PHOS: 88 IU/L (ref 39–117)
ALT: 26 IU/L (ref 0–44)
AST: 25 IU/L (ref 0–40)
Albumin/Globulin Ratio: 1.8 (ref 1.2–2.2)
Albumin: 4.5 g/dL (ref 3.5–5.5)
BUN/Creatinine Ratio: 12 (ref 9–20)
BUN: 15 mg/dL (ref 6–24)
Bilirubin Total: <0.2 mg/dL (ref 0.0–1.2)
CALCIUM: 9.8 mg/dL (ref 8.7–10.2)
CO2: 25 mmol/L (ref 18–29)
CREATININE: 1.21 mg/dL (ref 0.76–1.27)
Chloride: 97 mmol/L (ref 96–106)
GFR calc Af Amer: 85 mL/min/{1.73_m2} (ref >59)
GFR calc non Af Amer: 74 mL/min/{1.73_m2} (ref >59)
GLOBULIN, TOTAL: 2.5 g/dL (ref 1.5–4.5)
Glucose: 91 mg/dL (ref 65–99)
POTASSIUM: 4.3 mmol/L (ref 3.5–5.2)
SODIUM: 138 mmol/L (ref 134–144)
Total Protein: 7 g/dL (ref 6.0–8.5)

## 2016-01-04 LAB — TSH: TSH: 1.41 u[IU]/mL (ref 0.450–4.500)

## 2016-01-04 LAB — T4, FREE: FREE T4: 1.65 ng/dL (ref 0.82–1.77)

## 2016-01-04 LAB — VITAMIN D 25 HYDROXY (VIT D DEFICIENCY, FRACTURES): VIT D 25 HYDROXY: 17.5 ng/mL — AB (ref 30.0–100.0)

## 2016-01-10 ENCOUNTER — Encounter: Payer: Self-pay | Admitting: "Endocrinology

## 2016-01-10 ENCOUNTER — Ambulatory Visit (INDEPENDENT_AMBULATORY_CARE_PROVIDER_SITE_OTHER): Payer: 59 | Admitting: "Endocrinology

## 2016-01-10 VITALS — BP 122/70 | HR 84 | Ht 71.0 in | Wt 245.0 lb

## 2016-01-10 DIAGNOSIS — E038 Other specified hypothyroidism: Secondary | ICD-10-CM | POA: Diagnosis not present

## 2016-01-10 DIAGNOSIS — E559 Vitamin D deficiency, unspecified: Secondary | ICD-10-CM | POA: Diagnosis not present

## 2016-01-10 DIAGNOSIS — E782 Mixed hyperlipidemia: Secondary | ICD-10-CM | POA: Diagnosis not present

## 2016-01-10 DIAGNOSIS — R7303 Prediabetes: Secondary | ICD-10-CM | POA: Diagnosis not present

## 2016-01-10 MED ORDER — VITAMIN D3 125 MCG (5000 UT) PO CAPS: 5000.0000 [IU] | ORAL_CAPSULE | Freq: Every day | ORAL | 0 refills | Status: DC

## 2016-01-10 MED ORDER — LEVOTHYROXINE SODIUM 88 MCG PO TABS: ORAL_TABLET | ORAL | 1 refills | Status: DC

## 2016-01-10 MED ORDER — ATORVASTATIN CALCIUM 40 MG PO TABS: 40.0000 mg | ORAL_TABLET | Freq: Every day | ORAL | 1 refills | Status: DC

## 2016-01-10 MED ORDER — METFORMIN HCL 500 MG PO TABS: 500.0000 mg | ORAL_TABLET | Freq: Every day | ORAL | 1 refills | Status: DC
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
## 2016-01-19 ENCOUNTER — Encounter (HOSPITAL_COMMUNITY): Payer: Self-pay | Admitting: Psychiatry

## 2016-01-19 ENCOUNTER — Ambulatory Visit (INDEPENDENT_AMBULATORY_CARE_PROVIDER_SITE_OTHER): Payer: 59 | Admitting: Psychiatry

## 2016-01-19 VITALS — BP 116/67 | HR 89 | Ht 71.0 in | Wt 243.2 lb

## 2016-01-19 DIAGNOSIS — Z880 Allergy status to penicillin: Secondary | ICD-10-CM | POA: Diagnosis not present

## 2016-01-19 DIAGNOSIS — F3162 Bipolar disorder, current episode mixed, moderate: Secondary | ICD-10-CM

## 2016-01-19 DIAGNOSIS — Z818 Family history of other mental and behavioral disorders: Secondary | ICD-10-CM

## 2016-01-19 DIAGNOSIS — Z811 Family history of alcohol abuse and dependence: Secondary | ICD-10-CM

## 2016-01-19 DIAGNOSIS — Z888 Allergy status to other drugs, medicaments and biological substances status: Secondary | ICD-10-CM | POA: Diagnosis not present

## 2016-01-19 DIAGNOSIS — Z79899 Other long term (current) drug therapy: Secondary | ICD-10-CM

## 2016-01-19 DIAGNOSIS — F902 Attention-deficit hyperactivity disorder, combined type: Secondary | ICD-10-CM | POA: Diagnosis not present

## 2016-01-19 MED ORDER — AMPHETAMINE-DEXTROAMPHET ER 30 MG PO CP24: 30.0000 mg | ORAL_CAPSULE | Freq: Every day | ORAL | 0 refills | Status: DC

## 2016-01-19 MED ORDER — LURASIDONE HCL 80 MG PO TABS: 80.0000 mg | ORAL_TABLET | Freq: Every day | ORAL | 2 refills | Status: DC

## 2016-01-19 MED ORDER — ALPRAZOLAM 1 MG PO TABS: 1.0000 mg | ORAL_TABLET | Freq: Three times a day (TID) | ORAL | 2 refills | Status: DC | PRN
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
## 2016-01-24 ENCOUNTER — Ambulatory Visit (INDEPENDENT_AMBULATORY_CARE_PROVIDER_SITE_OTHER): Payer: 59 | Admitting: Psychiatry

## 2016-01-24 ENCOUNTER — Encounter (HOSPITAL_COMMUNITY): Payer: Self-pay | Admitting: Psychiatry

## 2016-01-24 VITALS — BP 102/67 | HR 83 | Wt 247.0 lb

## 2016-01-24 DIAGNOSIS — F902 Attention-deficit hyperactivity disorder, combined type: Secondary | ICD-10-CM

## 2016-01-24 DIAGNOSIS — F3162 Bipolar disorder, current episode mixed, moderate: Secondary | ICD-10-CM
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
## 2016-01-25 ENCOUNTER — Telehealth (HOSPITAL_COMMUNITY): Payer: Self-pay | Admitting: *Deleted

## 2016-01-25 NOTE — Telephone Encounter (Signed)
left voice message regarding scheduling appointments with [name].

## 2016-02-08 ENCOUNTER — Ambulatory Visit (INDEPENDENT_AMBULATORY_CARE_PROVIDER_SITE_OTHER): Payer: 59 | Admitting: Psychiatry

## 2016-02-08 ENCOUNTER — Encounter (HOSPITAL_COMMUNITY): Payer: Self-pay | Admitting: Psychiatry

## 2016-02-08 DIAGNOSIS — F902 Attention-deficit hyperactivity disorder, combined type: Secondary | ICD-10-CM | POA: Diagnosis not present

## 2016-02-08 DIAGNOSIS — F3162 Bipolar disorder, current episode mixed, moderate: Secondary | ICD-10-CM

## 2016-02-08 NOTE — Progress Notes (Signed)
Patient:  Glen Jacobson   DOB: Nov 08, 1975  MR Number: [id number]  [facility]:  [address] [place],  [place], [id number]  Start: Tuesday 02/08/2016 8:12 AM End: Tuesday 02/08/2016 8:59 AM  Provider/Observer:     Florencia ReasonsPeggy Mckenzye Cutright, MSW, LCSW   Chief Complaint:     Anxiety, Depression  Reason For Service:    Glen Jacobson is a 40 y.o. male who  presents with long standing history of symptoms of depression and anxiety beginning in early childhood. Patient states feeling guilty all the time for no reason, having a lot of anxiety, and not wanting to be here 90% of the time. He also reports being angry the majority of the time. He reports stress related to 2 teenage daughters who reside with their mother. Daughters have on and off contact with dad.  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 Other major stressor is not understanding his illness.   Interventions Strategy:  Supportive, psychoeducation   Participation Level:   Active  Participation Quality:  Drowsy      Behavioral Observation:  Fairly Groomed, Drowsy, and Appropriate.   Current Psychosocial Factors: Mental illness, strained relationship with daughters  Content of Session:   Established rapport, reviewed symptoms, facilitated expression of feelings, explored interests, began to provide psychoeducation regarding illness, assisted patient identify ways to improve self-care especially around nutrition, discussed rationale for and practice controlled breathing  Current Status:   Excessive worry, feeling down, anxiety, easily agitated, anger, feelings of guilt  Suicidal/Homicidal:   No  Patient Progress:   Fair. Patient repots little to no change in symptoms since last session. He has eliminated use of energy drinks and reports this has reduced some anxiety. He reports continued support from wife and father. He continues to experience racing thoughts and states not knowing how to relax.   Target  Goals:   1. Establish rapport. 2. Learn and implement strategies to improve self care and reduce anxiety.   Last Reviewed:     Goals Addressed Today:    1,2  Plan:   Return in 2 weeks and implement strategies discussed in session.  Impression/Diagnosis:   Patient presents with long standing history of symptoms of depression and anxiety beginning in early childhood. He reports multiple psychiatric hosptializatioins for depression and suicidal thoughts/attempts.  The last one occured when he was a senior in high school. Patient reports self medicating with alcohol and drugs throughout high school. He began taking psychotropic medication about a year ago when he started seeing psychiatrist Dr. Tenny Crawoss.  He reports no previous involvement in outpatient therapy. Current symptoms include excessive worry, racing thoughts, feeling down, anxiety, irritability, agitation and anger outbursts.  Diagnosis:  Axis I: ADHD, Bipolar Disorder          Axis II: Deferred   Marvel Sapp, LCSW 02/08/2016

## 2016-02-22 ENCOUNTER — Ambulatory Visit (INDEPENDENT_AMBULATORY_CARE_PROVIDER_SITE_OTHER): Payer: 59 | Admitting: Psychiatry

## 2016-02-22 DIAGNOSIS — F902 Attention-deficit hyperactivity disorder, combined type: Secondary | ICD-10-CM

## 2016-02-22 DIAGNOSIS — F3162 Bipolar disorder, current episode mixed, moderate: Secondary | ICD-10-CM | POA: Diagnosis not present

## 2016-02-22 NOTE — Progress Notes (Signed)
Patient:  Glen ReeveJohn G Jacobson   DOB: [birth year]  MR Number: [id number]  Location[facility]:  [address] [place],  [place], [id number]  Start: Tuesday 02/22/2016 8:10 AM End: Tuesday 02/22/2016 9:10 AM  Provider/Observer:     Florencia ReasonsPeggy Orie Cuttino, MSW, LCSW   Chief Complaint:     Anxiety, Depression  Reason For Service:    Glen ReeveJohn G Jacobson is a 40 y.o. male who  presents with long standing history of symptoms of depression and anxiety beginning in early childhood. Patient states feeling guilty all the time for no reason, having a lot of anxiety, and not wanting to be here 90% of the time. He also reports being angry the majority of the time. He reports stress related to 2 teenage daughters who reside with their mother. Daughters have on and off contact with dad.  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 Other major stressor is not understanding his illness.   Interventions Strategy:  Supportive, psychoeducation   Participation Level:   Active  Participation Quality:  Drowsy      Behavioral Observation:  Fairly Groomed, Drowsy, and Appropriate.   Current Psychosocial Factors: Mental illness, strained relationship with daughters, work hours,   Content of Session:    reviewed symptoms, facilitated expression of feelings, praised and reinforced patient's use of controlled breathing and efforts to improve self-care regarding eating patterns,  discussed patient's strengths and support system,developed treatment plan, assisted patient began to identify early signs of anger, assigned patient to continue practicing relaxation technique daily  Current Status:   Decreased worry, decreased anxiety, less easily agitated, continued feelings of guilt  Suicidal/Homicidal:   No  Patient Progress:   Fair. Patient reports being in stable mood since last session and reports no symptoms of depression. He cites a couple of incidents of becoming upset with daughters but reports being more  aware of his actions and feelings resulting in patient choosing to disengage before situations escalated. He reports practicing controlled breathing consistently and says it has helped him calm down in some situations. He continues to experience racing thoughts and states not knowing how to relax. He also continues to experience constant feelings of guilt.  Target Goals:   1. Identify replaced thoughts and beliefs that support depression.    2. Process and resolve feelings of guilt and regrets associated with the past.    3. Learn and implement calming strategies to reduce/manage anxiety and stress.  Last Reviewed:   02/22/2016  Goals Addressed Today:    1,2  Plan:   Return in 2 weeks and implement strategies discussed in session.  Impression/Diagnosis:   Patient presents with long standing history of symptoms of depression and anxiety beginning in early childhood. He reports multiple psychiatric hosptializatioins for depression and suicidal thoughts/attempts.  The last one occured when he was a senior in high school. Patient reports self medicating with alcohol and drugs throughout high school. He began taking psychotropic medication about a year ago when he started seeing psychiatrist Dr. Tenny Crawoss.  He reports no previous involvement in outpatient therapy. Current symptoms include excessive worry, racing thoughts, feeling down, anxiety, irritability, agitation and anger outbursts.  Diagnosis:  Axis I: ADHD, Bipolar Disorder          Axis II: Deferred   Jibreel Fedewa, LCSW 02/22/2016

## 2016-03-08 ENCOUNTER — Encounter (HOSPITAL_COMMUNITY): Payer: Self-pay | Admitting: Psychiatry

## 2016-03-08 ENCOUNTER — Ambulatory Visit (INDEPENDENT_AMBULATORY_CARE_PROVIDER_SITE_OTHER): Payer: 59 | Admitting: Psychiatry

## 2016-03-08 DIAGNOSIS — F3162 Bipolar disorder, current episode mixed, moderate: Secondary | ICD-10-CM

## 2016-03-08 DIAGNOSIS — F902 Attention-deficit hyperactivity disorder, combined type: Secondary | ICD-10-CM | POA: Diagnosis not present

## 2016-03-08 NOTE — Progress Notes (Signed)
Patient:  Glen Jacobson   DOB: Jul 31, 1975  MR Number: [id number]  Location: [facility]:  [address] [place] [place],  [place], [address]  Start: Wednesday 03/08/2016 9:15 AM  End: Wednesday 03/08/2016 10:00 AM  Provider/Observer:     Florencia Reasons, MSW, LCSW   Chief Complaint:     Anxiety, Depression  Reason For Service:    Glen Jacobson is a 41 y.o. male who  presents with long standing history of symptoms of depression and anxiety beginning in early childhood. Patient states feeling guilty all the time for no reason, having a lot of anxiety, and not wanting to be here 90% of the time. He also reports being angry the majority of the time. He reports stress related to 2 teenage daughters who reside with their mother. Daughters have on and off contact with dad.  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 Other major stressor is not understanding his illness.   Interventions Strategy:  Supportive, psychoeducation   Participation Level:   Active  Participation Quality:  Drowsy      Behavioral Observation:  Fairly Groomed, Drowsy, and Appropriate.   Current Psychosocial Factors: Mental illness, strained relationship with daughters, work hours, recent bidding process at work  Content of Session:    reviewed symptoms, facilitated expression of feelings, assisted patient identify triggers of anxiety and depression, began to explore patient' schema and discussed the effects on his mood and behaviors, assisted patient identify and replace negative thought patterns about treatment with healthy realistic alternatives, discussed rationale for and practiced mindfulness exercise using breath awareness, assigned patient to practice mindfulness exercise daily, provided patient with handout to assist, assisted patient explore other relaxation techniques, tried to discuss importance of self-care regarding eating patterns     Current Status:   increased worry,increased anxiety, easily  agitated, continued feelings of guilt, racing thoughts  Suicidal/Homicidal:   No  Patient Progress:   Fair. Patient reports increased racing thoughts and having more difficulty controlling his anger. He says little things set him off and reports verbally aggressive outbursts but denies anything physical. He worries about a variety of issues but he is particularly stressed by the relationship with his daughters and reports recent conflict with his youngest daughter. 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 He  expresses frustration regarding having bipolar disorder especially the racing thoughts and reports hopelessness about treatment. He  states his mind never shuts down and reports constant worry and guilt.  He admits he has had passive suicidal thoughts but with no plan and no intent.  he reports he continues to practice controlled breathing but reports it is not as effective as it was. He also reports that he is just not able to improve his eating patterns and continues to skip meals. Patient is scheduled to see psychiatrist Dr. Tenny Craw for medication management next week and will discuss concerns about medication at that time.  Target Goals:   1. Identify and replace thoughts and beliefs that support depression.    2. Process and resolve feelings of guilt and regrets associated with the past.    3. Learn and implement calming strategies to reduce/manage anxiety and stress.  Last Reviewed:   02/22/2016  Goals Addressed Today:    1,3  Plan:   Return in 2 weeks and implement strategies discussed in session.  Impression/Diagnosis:   Patient presents with long  standing history of symptoms of depression and anxiety beginning in early childhood. He reports multiple psychiatric hosptializatioins for depression and suicidal thoughts/attempts.  The last one occured when he was a senior in high school. Patient reports  self medicating with alcohol and drugs throughout high school. He began taking psychotropic medication about a year ago when he started seeing psychiatrist Dr. Tenny Crawoss.  He reports no previous involvement in outpatient therapy. Current symptoms include excessive worry, racing thoughts, feeling down, anxiety, irritability, agitation and anger outbursts.  Diagnosis:  Axis I: ADHD, Bipolar Disorder          Axis II: Deferred   Glen Govan, LCSW 03/08/2016

## 2016-03-13 ENCOUNTER — Ambulatory Visit (INDEPENDENT_AMBULATORY_CARE_PROVIDER_SITE_OTHER): Payer: 59 | Admitting: Psychiatry

## 2016-03-13 ENCOUNTER — Encounter (HOSPITAL_COMMUNITY): Payer: Self-pay | Admitting: Psychiatry

## 2016-03-13 VITALS — BP 117/76 | HR 84 | Ht 71.0 in | Wt 244.0 lb

## 2016-03-13 DIAGNOSIS — F3162 Bipolar disorder, current episode mixed, moderate: Secondary | ICD-10-CM | POA: Diagnosis not present

## 2016-03-13 DIAGNOSIS — F902 Attention-deficit hyperactivity disorder, combined type: Secondary | ICD-10-CM | POA: Diagnosis not present

## 2016-03-13 DIAGNOSIS — Z880 Allergy status to penicillin: Secondary | ICD-10-CM

## 2016-03-13 DIAGNOSIS — Z811 Family history of alcohol abuse and dependence: Secondary | ICD-10-CM

## 2016-03-13 DIAGNOSIS — Z79899 Other long term (current) drug therapy: Secondary | ICD-10-CM

## 2016-03-13 DIAGNOSIS — Z888 Allergy status to other drugs, medicaments and biological substances status: Secondary | ICD-10-CM | POA: Diagnosis not present

## 2016-03-13 DIAGNOSIS — Z818 Family history of other mental and behavioral disorders: Secondary | ICD-10-CM

## 2016-03-13 MED ORDER — LURASIDONE HCL 120 MG PO TABS: 120.0000 mg | ORAL_TABLET | Freq: Every day | ORAL | 2 refills | Status: DC

## 2016-03-13 MED ORDER — CLONAZEPAM 1 MG PO TABS: 1.0000 mg | ORAL_TABLET | Freq: Three times a day (TID) | ORAL | 2 refills | Status: DC

## 2016-03-13 MED ORDER — QUETIAPINE FUMARATE 25 MG PO TABS: ORAL_TABLET | ORAL | 2 refills | Status: DC
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
## 2016-03-14 ENCOUNTER — Ambulatory Visit (HOSPITAL_COMMUNITY): Payer: Self-pay | Admitting: Psychiatry

## 2016-03-15 ENCOUNTER — Ambulatory Visit (HOSPITAL_COMMUNITY): Payer: Self-pay | Admitting: Psychiatry

## 2016-03-20 ENCOUNTER — Ambulatory Visit (HOSPITAL_COMMUNITY): Payer: Self-pay | Admitting: Psychiatry

## 2016-04-10 ENCOUNTER — Ambulatory Visit (INDEPENDENT_AMBULATORY_CARE_PROVIDER_SITE_OTHER): Payer: 59 | Admitting: Psychiatry

## 2016-04-10 ENCOUNTER — Encounter (HOSPITAL_COMMUNITY): Payer: Self-pay | Admitting: Psychiatry

## 2016-04-10 VITALS — BP 107/70 | HR 51 | Ht 71.0 in | Wt 246.2 lb

## 2016-04-10 DIAGNOSIS — Z818 Family history of other mental and behavioral disorders: Secondary | ICD-10-CM

## 2016-04-10 DIAGNOSIS — Z888 Allergy status to other drugs, medicaments and biological substances status: Secondary | ICD-10-CM

## 2016-04-10 DIAGNOSIS — F3162 Bipolar disorder, current episode mixed, moderate: Secondary | ICD-10-CM

## 2016-04-10 DIAGNOSIS — Z79899 Other long term (current) drug therapy: Secondary | ICD-10-CM

## 2016-04-10 DIAGNOSIS — Z811 Family history of alcohol abuse and dependence: Secondary | ICD-10-CM

## 2016-04-10 DIAGNOSIS — Z880 Allergy status to penicillin: Secondary | ICD-10-CM

## 2016-04-10 MED ORDER — LURASIDONE HCL 120 MG PO TABS: 120.0000 mg | ORAL_TABLET | Freq: Every day | ORAL | 2 refills | Status: DC

## 2016-04-10 MED ORDER — QUETIAPINE FUMARATE 25 MG PO TABS: ORAL_TABLET | ORAL | 2 refills | Status: DC

## 2016-04-10 MED ORDER — CLONAZEPAM 1 MG PO TABS
1.0000 mg | ORAL_TABLET | Freq: Three times a day (TID) | ORAL | 2 refills | Status: DC
Start: 2016-04-10 — End: 2016-07-12
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
## 2016-06-29 ENCOUNTER — Telehealth (HOSPITAL_COMMUNITY): Payer: Self-pay | Admitting: *Deleted

## 2016-06-29 NOTE — Telephone Encounter (Signed)
Pt called stating he would like to cancel appt for 07-03-2016 due to insurance difficulties. Per pt he can not afford his medications right now either until he gets his insurance back.Per pt he will call back to resch appt once he gets insurance.

## 2016-06-29 NOTE — Telephone Encounter (Signed)
Ok but please inform him about [facility]. He typically does not do well off meds

## 2016-06-29 NOTE — Telephone Encounter (Signed)
Called pt per previous message. Spoke with pt and informed him with what provider stated. Pt verbalized understanding. Pt then stated, he have insurance but he don't know when he's going to receive it in the mail. Per pt he just don't have the money to pay for anything without insurance covering. Informed pt message will be sent to provider.

## 2016-07-03 ENCOUNTER — Ambulatory Visit (HOSPITAL_COMMUNITY): Payer: Self-pay | Admitting: Psychiatry

## 2016-07-08 ENCOUNTER — Other Ambulatory Visit: Payer: Self-pay | Admitting: "Endocrinology

## 2016-07-10 ENCOUNTER — Ambulatory Visit: Payer: 59 | Admitting: "Endocrinology

## 2016-07-12 ENCOUNTER — Encounter (HOSPITAL_COMMUNITY): Payer: Self-pay | Admitting: Psychiatry

## 2016-07-12 ENCOUNTER — Ambulatory Visit (INDEPENDENT_AMBULATORY_CARE_PROVIDER_SITE_OTHER): Payer: 59 | Admitting: Psychiatry

## 2016-07-12 VITALS — BP 118/86 | HR 68 | Ht 71.0 in | Wt 248.0 lb

## 2016-07-12 DIAGNOSIS — Z79899 Other long term (current) drug therapy: Secondary | ICD-10-CM

## 2016-07-12 DIAGNOSIS — Z811 Family history of alcohol abuse and dependence: Secondary | ICD-10-CM

## 2016-07-12 DIAGNOSIS — Z818 Family history of other mental and behavioral disorders: Secondary | ICD-10-CM | POA: Diagnosis not present

## 2016-07-12 DIAGNOSIS — F3162 Bipolar disorder, current episode mixed, moderate: Secondary | ICD-10-CM

## 2016-07-12 MED ORDER — CLONAZEPAM 1 MG PO TABS: 1.0000 mg | ORAL_TABLET | Freq: Three times a day (TID) | ORAL | 2 refills | Status: DC

## 2016-07-12 MED ORDER — LURASIDONE HCL 120 MG PO TABS: 120.0000 mg | ORAL_TABLET | Freq: Every day | ORAL | 2 refills | Status: DC
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
## 2016-07-21 ENCOUNTER — Other Ambulatory Visit (HOSPITAL_COMMUNITY): Payer: Self-pay | Admitting: Psychiatry

## 2016-07-25 ENCOUNTER — Other Ambulatory Visit (HOSPITAL_COMMUNITY): Payer: Self-pay | Admitting: Psychiatry

## 2016-07-28 LAB — LIPID PANEL
CHOLESTEROL TOTAL: 238 mg/dL — AB (ref 100–199)
Chol/HDL Ratio: 9.5 ratio — ABNORMAL HIGH (ref 0.0–5.0)
HDL: 25 mg/dL — ABNORMAL LOW (ref >39)
LDL Calculated: 161 mg/dL — ABNORMAL HIGH (ref 0–99)
Triglycerides: 261 mg/dL — ABNORMAL HIGH (ref 0–149)
VLDL CHOLESTEROL CAL: 52 mg/dL — AB (ref 5–40)

## 2016-07-28 LAB — TSH: TSH: 2.06 u[IU]/mL (ref 0.450–4.500)

## 2016-07-28 LAB — T4, FREE: FREE T4: 1.5 ng/dL (ref 0.82–1.77)

## 2016-07-28 LAB — TESTOSTERONE, FREE, TOTAL, SHBG
SEX HORMONE BINDING: 64.5 nmol/L — AB (ref 16.5–55.9)
Testosterone, Free: 7.6 pg/mL (ref 6.8–21.5)
Testosterone: 340 ng/dL (ref 264–916)

## 2016-07-28 LAB — VITAMIN D 25 HYDROXY (VIT D DEFICIENCY, FRACTURES): Vit D, 25-Hydroxy: 18 ng/mL — ABNORMAL LOW (ref 30.0–100.0)

## 2016-07-28 LAB — HEMOGLOBIN A1C
ESTIMATED AVERAGE GLUCOSE: 117 mg/dL
Hgb A1c MFr Bld: 5.7 % — ABNORMAL HIGH (ref 4.8–5.6)

## 2016-08-04 ENCOUNTER — Encounter: Payer: Self-pay | Admitting: "Endocrinology

## 2016-08-04 ENCOUNTER — Ambulatory Visit (INDEPENDENT_AMBULATORY_CARE_PROVIDER_SITE_OTHER): Payer: 59 | Admitting: "Endocrinology

## 2016-08-04 VITALS — BP 115/75 | HR 68 | Ht 71.0 in | Wt 251.0 lb

## 2016-08-04 DIAGNOSIS — E559 Vitamin D deficiency, unspecified: Secondary | ICD-10-CM | POA: Diagnosis not present

## 2016-08-04 DIAGNOSIS — F17200 Nicotine dependence, unspecified, uncomplicated: Secondary | ICD-10-CM | POA: Diagnosis not present

## 2016-08-04 DIAGNOSIS — R7303 Prediabetes: Secondary | ICD-10-CM

## 2016-08-04 DIAGNOSIS — E782 Mixed hyperlipidemia: Secondary | ICD-10-CM | POA: Diagnosis not present

## 2016-08-04 DIAGNOSIS — E038 Other specified hypothyroidism: Secondary | ICD-10-CM | POA: Diagnosis not present

## 2016-08-04 MED ORDER — LEVOTHYROXINE SODIUM 88 MCG PO TABS: ORAL_TABLET | ORAL | 0 refills | Status: DC

## 2016-08-04 MED ORDER — METFORMIN HCL 500 MG PO TABS: 500.0000 mg | ORAL_TABLET | Freq: Every day | ORAL | 1 refills | Status: DC

## 2016-08-04 MED ORDER — ATORVASTATIN CALCIUM 40 MG PO TABS: 40.0000 mg | ORAL_TABLET | Freq: Every day | ORAL | 1 refills | Status: DC

## 2016-08-04 MED ORDER — VITAMIN D (ERGOCALCIFEROL) 1.25 MG (50000 UNIT) PO CAPS: 50000.0000 [IU] | ORAL_CAPSULE | ORAL | 0 refills | Status: DC
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
## 2016-10-29 ENCOUNTER — Other Ambulatory Visit (HOSPITAL_COMMUNITY): Payer: Self-pay | Admitting: Psychiatry

## 2016-11-07 ENCOUNTER — Other Ambulatory Visit: Payer: Self-pay | Admitting: "Endocrinology

## 2016-11-13 ENCOUNTER — Encounter (HOSPITAL_COMMUNITY): Payer: Self-pay | Admitting: Psychiatry

## 2016-11-13 ENCOUNTER — Ambulatory Visit (INDEPENDENT_AMBULATORY_CARE_PROVIDER_SITE_OTHER): Payer: 59 | Admitting: Psychiatry

## 2016-11-13 VITALS — BP 116/69 | HR 54 | Ht 71.0 in | Wt 258.0 lb

## 2016-11-13 DIAGNOSIS — F3162 Bipolar disorder, current episode mixed, moderate: Secondary | ICD-10-CM

## 2016-11-13 DIAGNOSIS — F17200 Nicotine dependence, unspecified, uncomplicated: Secondary | ICD-10-CM | POA: Diagnosis not present

## 2016-11-13 DIAGNOSIS — Z818 Family history of other mental and behavioral disorders: Secondary | ICD-10-CM | POA: Diagnosis not present

## 2016-11-13 DIAGNOSIS — Z811 Family history of alcohol abuse and dependence: Secondary | ICD-10-CM

## 2016-11-13 DIAGNOSIS — G4700 Insomnia, unspecified: Secondary | ICD-10-CM | POA: Diagnosis not present

## 2016-11-13 DIAGNOSIS — F419 Anxiety disorder, unspecified: Secondary | ICD-10-CM

## 2016-11-13 DIAGNOSIS — Z79899 Other long term (current) drug therapy: Secondary | ICD-10-CM

## 2016-11-13 DIAGNOSIS — F3160 Bipolar disorder, current episode mixed, unspecified: Secondary | ICD-10-CM | POA: Diagnosis not present

## 2016-11-13 MED ORDER — ALPRAZOLAM 1 MG PO TABS: 1.0000 mg | ORAL_TABLET | Freq: Three times a day (TID) | ORAL | 2 refills | Status: DC

## 2016-11-13 MED ORDER — LURASIDONE HCL 120 MG PO TABS: 120.0000 mg | ORAL_TABLET | Freq: Every day | ORAL | 2 refills | Status: DC
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
## 2016-12-28 ENCOUNTER — Other Ambulatory Visit (HOSPITAL_COMMUNITY): Payer: Self-pay | Admitting: Psychiatry

## 2017-01-22 ENCOUNTER — Other Ambulatory Visit: Payer: Self-pay | Admitting: "Endocrinology

## 2017-01-23 LAB — T4, FREE: Free T4: 1.58 ng/dL (ref 0.82–1.77)

## 2017-01-23 LAB — TSH: TSH: 2.22 u[IU]/mL (ref 0.450–4.500)

## 2017-01-23 LAB — VITAMIN D 25 HYDROXY (VIT D DEFICIENCY, FRACTURES): VIT D 25 HYDROXY: 26.7 ng/mL — AB (ref 30.0–100.0)

## 2017-01-26 ENCOUNTER — Other Ambulatory Visit: Payer: Self-pay | Admitting: "Endocrinology

## 2017-02-05 ENCOUNTER — Ambulatory Visit: Payer: 59 | Admitting: "Endocrinology

## 2017-02-06 ENCOUNTER — Ambulatory Visit (INDEPENDENT_AMBULATORY_CARE_PROVIDER_SITE_OTHER): Payer: 59 | Admitting: Psychiatry

## 2017-02-06 ENCOUNTER — Encounter (HOSPITAL_COMMUNITY): Payer: Self-pay | Admitting: Psychiatry

## 2017-02-06 VITALS — BP 120/76 | HR 105 | Ht 71.0 in | Wt 244.0 lb

## 2017-02-06 DIAGNOSIS — F17210 Nicotine dependence, cigarettes, uncomplicated: Secondary | ICD-10-CM

## 2017-02-06 DIAGNOSIS — R05 Cough: Secondary | ICD-10-CM | POA: Diagnosis not present

## 2017-02-06 DIAGNOSIS — M791 Myalgia, unspecified site: Secondary | ICD-10-CM

## 2017-02-06 DIAGNOSIS — F3162 Bipolar disorder, current episode mixed, moderate: Secondary | ICD-10-CM | POA: Diagnosis not present

## 2017-02-06 MED ORDER — LURASIDONE HCL 120 MG PO TABS: 120.0000 mg | ORAL_TABLET | Freq: Every day | ORAL | 2 refills | Status: DC

## 2017-02-06 MED ORDER — ALPRAZOLAM 1 MG PO TABS: 1.0000 mg | ORAL_TABLET | Freq: Three times a day (TID) | ORAL | 2 refills | Status: DC
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
## 2017-02-07 ENCOUNTER — Encounter: Payer: Self-pay | Admitting: "Endocrinology

## 2017-02-07 ENCOUNTER — Ambulatory Visit (INDEPENDENT_AMBULATORY_CARE_PROVIDER_SITE_OTHER): Payer: 59 | Admitting: "Endocrinology

## 2017-02-07 VITALS — BP 137/87 | HR 94 | Ht 71.0 in | Wt 248.0 lb

## 2017-02-07 DIAGNOSIS — R7303 Prediabetes: Secondary | ICD-10-CM

## 2017-02-07 DIAGNOSIS — E559 Vitamin D deficiency, unspecified: Secondary | ICD-10-CM | POA: Diagnosis not present

## 2017-02-07 DIAGNOSIS — E782 Mixed hyperlipidemia: Secondary | ICD-10-CM

## 2017-02-07 DIAGNOSIS — E038 Other specified hypothyroidism: Secondary | ICD-10-CM | POA: Diagnosis not present

## 2017-02-07 MED ORDER — VITAMIN D3 125 MCG (5000 UT) PO CAPS: 5000.0000 [IU] | ORAL_CAPSULE | Freq: Every day | ORAL | 0 refills | Status: DC

## 2017-02-07 MED ORDER — LEVOTHYROXINE SODIUM 88 MCG PO TABS: ORAL_TABLET | ORAL | 1 refills | Status: DC

## 2017-02-07 MED ORDER — METFORMIN HCL 500 MG PO TABS: ORAL_TABLET | ORAL | 1 refills | Status: DC

## 2017-02-07 MED ORDER — ATORVASTATIN CALCIUM 40 MG PO TABS: 40.0000 mg | ORAL_TABLET | Freq: Every day | ORAL | 1 refills | Status: DC
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
## 2017-03-12 ENCOUNTER — Telehealth (HOSPITAL_COMMUNITY): Payer: Self-pay | Admitting: *Deleted

## 2017-03-12 NOTE — Telephone Encounter (Signed)
I can get him samples of the Latuda, he did not do well on other meds

## 2017-03-12 NOTE — Telephone Encounter (Signed)
ok 

## 2017-03-12 NOTE — Telephone Encounter (Signed)
Spoke with patient & he is accepting the samples

## 2017-03-12 NOTE — Telephone Encounter (Signed)
Dr Tenny Crawoss Patient called in stating that he can't afford refills on Latuda. That he chose the wrong insurance coverage. He's asking for another type of medication that will be more affordable.

## 2017-04-17 ENCOUNTER — Other Ambulatory Visit: Payer: Self-pay | Admitting: Neurosurgery

## 2017-04-23 NOTE — Pre-Procedure Instructions (Signed)
    Glen ReeveJohn G Jacobson  04/23/2017      [facility] - [place] [place] - [address] [place] [id number] Phone: [phone] Fax: [phone]    Your procedure is scheduled on 04/30/17.  Report to [facility] Ad[facility] at 530 A.M.  Call this number if you have problems the morning of surgery:  [phone]   Remember:  Do not eat food or drink liquids after midnight.  Take these medicines the morning of surgery with A SIP OF WATER ---Glen MtXANAX,NEURONTIN,SYNTHROID   Do not wear jewelry, make-up or nail polish.  Do not wear lotions, powders, or perfumes, or deodorant.  Do not shave 48 hours prior to surgery.  Men may shave face and neck.  Do not bring valuables to the hospital.  [facility] is not responsible for any belongings or valuables.  Contacts, dentures or bridgework may not be worn into surgery.  Leave your suitcase in the car.  After surgery it may be brought to your room.  For patients admitted to the hospital, discharge time will be determined by your treatment team.  Patients discharged the day of surgery will not be allowed to drive home.   Name and phone number of your driver:    Special instructions:  Do not take any aspirin,anti-inflammatories,vitamins,or herbal supplements 5-7 days prior to surgery.  Please read over the following fact sheets that you were given.

## 2017-04-24 ENCOUNTER — Encounter (HOSPITAL_COMMUNITY): Payer: Self-pay

## 2017-04-24 ENCOUNTER — Other Ambulatory Visit (HOSPITAL_COMMUNITY): Payer: Self-pay | Admitting: Psychiatry

## 2017-04-24 ENCOUNTER — Encounter (HOSPITAL_COMMUNITY)
Admission: RE | Admit: 2017-04-24 | Discharge: 2017-04-24 | Disposition: A | Discharge status: Home / Self Care | Payer: 59 | Source: Ambulatory Visit | Attending: Neurosurgery | Admitting: Neurosurgery

## 2017-04-24 DIAGNOSIS — E039 Hypothyroidism, unspecified: Secondary | ICD-10-CM | POA: Diagnosis not present

## 2017-04-24 DIAGNOSIS — F17210 Nicotine dependence, cigarettes, uncomplicated: Secondary | ICD-10-CM | POA: Diagnosis not present

## 2017-04-24 DIAGNOSIS — Z01818 Encounter for other preprocedural examination: Secondary | ICD-10-CM | POA: Diagnosis not present

## 2017-04-24 DIAGNOSIS — I10 Essential (primary) hypertension: Secondary | ICD-10-CM | POA: Diagnosis not present

## 2017-04-24 DIAGNOSIS — E119 Type 2 diabetes mellitus without complications: Secondary | ICD-10-CM | POA: Diagnosis not present

## 2017-04-24 HISTORY — DX: Personal history of urinary calculi: Z87.442

## 2017-04-24 LAB — BASIC METABOLIC PANEL
Anion gap: 13 (ref 5–15)
BUN: <5 mg/dL — ABNORMAL LOW (ref 6–20)
CO2: 25 mmol/L (ref 22–32)
Calcium: 9.5 mg/dL (ref 8.9–10.3)
Chloride: 104 mmol/L (ref 101–111)
Creatinine, Ser: 0.99 mg/dL (ref 0.61–1.24)
GFR calc Af Amer: >60 mL/min (ref >60)
GFR calc non Af Amer: >60 mL/min (ref >60)
Glucose, Bld: 112 mg/dL — ABNORMAL HIGH (ref 65–99)
Potassium: 3.7 mmol/L (ref 3.5–5.1)
Sodium: 142 mmol/L (ref 135–145)

## 2017-04-24 LAB — CBC
HCT: 48.7 % (ref 39.0–52.0)
Hemoglobin: 16.1 g/dL (ref 13.0–17.0)
MCH: 30.7 pg (ref 26.0–34.0)
MCHC: 33.1 g/dL (ref 30.0–36.0)
MCV: 92.8 fL (ref 78.0–100.0)
Platelets: 353 10*3/uL (ref 150–400)
RBC: 5.25 MIL/uL (ref 4.22–5.81)
RDW: 13.3 % (ref 11.5–15.5)
WBC: 15.3 10*3/uL — ABNORMAL HIGH (ref 4.0–10.5)

## 2017-04-24 LAB — TYPE AND SCREEN
ABO/RH(D): A POS
Antibody Screen: NEGATIVE

## 2017-04-24 LAB — SURGICAL PCR SCREEN
MRSA, PCR: NEGATIVE
Staphylococcus aureus: NEGATIVE

## 2017-04-24 LAB — HEMOGLOBIN A1C
Hgb A1c MFr Bld: 6.1 % — ABNORMAL HIGH (ref 4.8–5.6)
Mean Plasma Glucose: 128.37 mg/dL

## 2017-04-24 LAB — ABO/RH: ABO/RH(D): A POS

## 2017-04-30 ENCOUNTER — Encounter (HOSPITAL_COMMUNITY): Admission: RE | Payer: Self-pay | Source: Ambulatory Visit

## 2017-04-30 ENCOUNTER — Inpatient Hospital Stay (HOSPITAL_COMMUNITY): Admission: RE | Admit: 2017-04-30 | Payer: 59 | Source: Ambulatory Visit | Admitting: Neurosurgery

## 2017-04-30 SURGERY — POSTERIOR LUMBAR FUSION 1 LEVEL
Anesthesia: General | Site: Back

## 2017-05-07 ENCOUNTER — Other Ambulatory Visit: Payer: Self-pay | Admitting: "Endocrinology

## 2017-05-07 ENCOUNTER — Encounter (HOSPITAL_COMMUNITY): Payer: Self-pay | Admitting: Psychiatry

## 2017-05-07 ENCOUNTER — Ambulatory Visit (INDEPENDENT_AMBULATORY_CARE_PROVIDER_SITE_OTHER): Payer: 59 | Admitting: Psychiatry

## 2017-05-07 VITALS — BP 126/89 | HR 75 | Ht 71.0 in | Wt 242.0 lb

## 2017-05-07 DIAGNOSIS — Z915 Personal history of self-harm: Secondary | ICD-10-CM

## 2017-05-07 DIAGNOSIS — R45851 Suicidal ideations: Secondary | ICD-10-CM | POA: Diagnosis not present

## 2017-05-07 DIAGNOSIS — Z811 Family history of alcohol abuse and dependence: Secondary | ICD-10-CM

## 2017-05-07 DIAGNOSIS — F419 Anxiety disorder, unspecified: Secondary | ICD-10-CM

## 2017-05-07 DIAGNOSIS — R450 Nervousness: Secondary | ICD-10-CM | POA: Diagnosis not present

## 2017-05-07 DIAGNOSIS — F17210 Nicotine dependence, cigarettes, uncomplicated: Secondary | ICD-10-CM

## 2017-05-07 DIAGNOSIS — M549 Dorsalgia, unspecified: Secondary | ICD-10-CM | POA: Diagnosis not present

## 2017-05-07 DIAGNOSIS — F3162 Bipolar disorder, current episode mixed, moderate: Secondary | ICD-10-CM | POA: Diagnosis not present

## 2017-05-07 DIAGNOSIS — Z818 Family history of other mental and behavioral disorders: Secondary | ICD-10-CM

## 2017-05-07 MED ORDER — ALPRAZOLAM 1 MG PO TABS: 1.0000 mg | ORAL_TABLET | Freq: Three times a day (TID) | ORAL | 2 refills | Status: DC

## 2017-05-07 MED ORDER — DULOXETINE HCL 60 MG PO CPEP: 60.0000 mg | ORAL_CAPSULE | Freq: Every day | ORAL | 2 refills | Status: DC

## 2017-05-07 MED ORDER — LURASIDONE HCL 120 MG PO TABS: ORAL_TABLET | ORAL | 2 refills | Status: DC
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
## 2017-05-08 ENCOUNTER — Other Ambulatory Visit: Payer: Self-pay

## 2017-05-08 ENCOUNTER — Encounter: Payer: Self-pay | Admitting: Physical Therapy

## 2017-05-08 ENCOUNTER — Ambulatory Visit: Payer: 59 | Attending: Neurosurgery | Admitting: Physical Therapy

## 2017-05-08 DIAGNOSIS — M5440 Lumbago with sciatica, unspecified side: Secondary | ICD-10-CM | POA: Diagnosis present

## 2017-05-08 DIAGNOSIS — M5416 Radiculopathy, lumbar region: Secondary | ICD-10-CM | POA: Insufficient documentation
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
## 2017-05-08 NOTE — Patient Instructions (Signed)
Stacy Simpson PT [facility] [address], [address] [place], [place] [address] Phone # [phone] Fax [phone]    

## 2017-05-10 ENCOUNTER — Ambulatory Visit: Payer: 59 | Admitting: Physical Therapy

## 2017-05-10 ENCOUNTER — Encounter: Payer: Self-pay | Admitting: Physical Therapy

## 2017-05-10 DIAGNOSIS — M5416 Radiculopathy, lumbar region: Secondary | ICD-10-CM

## 2017-05-10 DIAGNOSIS — M5440 Lumbago with sciatica, unspecified side: Secondary | ICD-10-CM
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
## 2017-05-14 ENCOUNTER — Ambulatory Visit: Payer: 59

## 2017-05-14 DIAGNOSIS — M5416 Radiculopathy, lumbar region: Secondary | ICD-10-CM

## 2017-05-14 DIAGNOSIS — M5440 Lumbago with sciatica, unspecified side: Secondary | ICD-10-CM
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
## 2017-05-16 ENCOUNTER — Ambulatory Visit: Payer: 59

## 2017-05-16 DIAGNOSIS — M5440 Lumbago with sciatica, unspecified side: Secondary | ICD-10-CM

## 2017-05-16 DIAGNOSIS — M5416 Radiculopathy, lumbar region: Secondary | ICD-10-CM
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
## 2017-05-18 ENCOUNTER — Encounter: Payer: Self-pay | Admitting: Physical Therapy

## 2017-05-24 ENCOUNTER — Ambulatory Visit: Payer: 59

## 2017-05-24 DIAGNOSIS — M5440 Lumbago with sciatica, unspecified side: Secondary | ICD-10-CM

## 2017-05-24 DIAGNOSIS — M5416 Radiculopathy, lumbar region: Secondary | ICD-10-CM | POA: Diagnosis not present
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
## 2017-05-29 ENCOUNTER — Encounter (HOSPITAL_COMMUNITY): Payer: Self-pay | Admitting: Psychiatry

## 2017-05-29 ENCOUNTER — Ambulatory Visit (INDEPENDENT_AMBULATORY_CARE_PROVIDER_SITE_OTHER): Payer: 59 | Admitting: Psychiatry

## 2017-05-29 VITALS — BP 122/87 | HR 76 | Ht 71.0 in | Wt 243.0 lb

## 2017-05-29 DIAGNOSIS — R45851 Suicidal ideations: Secondary | ICD-10-CM

## 2017-05-29 DIAGNOSIS — Z818 Family history of other mental and behavioral disorders: Secondary | ICD-10-CM

## 2017-05-29 DIAGNOSIS — Z811 Family history of alcohol abuse and dependence: Secondary | ICD-10-CM

## 2017-05-29 DIAGNOSIS — F17210 Nicotine dependence, cigarettes, uncomplicated: Secondary | ICD-10-CM

## 2017-05-29 DIAGNOSIS — F3162 Bipolar disorder, current episode mixed, moderate: Secondary | ICD-10-CM | POA: Diagnosis not present

## 2017-05-29 DIAGNOSIS — F419 Anxiety disorder, unspecified: Secondary | ICD-10-CM | POA: Diagnosis not present

## 2017-05-29 DIAGNOSIS — M549 Dorsalgia, unspecified: Secondary | ICD-10-CM | POA: Diagnosis not present

## 2017-05-29 MED ORDER — ALPRAZOLAM 1 MG PO TABS: 1.0000 mg | ORAL_TABLET | Freq: Three times a day (TID) | ORAL | 2 refills | Status: DC

## 2017-05-29 MED ORDER — LURASIDONE HCL 120 MG PO TABS: ORAL_TABLET | ORAL | 2 refills | Status: DC

## 2017-05-29 MED ORDER — DULOXETINE HCL 60 MG PO CPEP: 60.0000 mg | ORAL_CAPSULE | Freq: Two times a day (BID) | ORAL | 2 refills | Status: DC
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
## 2017-05-30 ENCOUNTER — Encounter: Payer: Self-pay | Admitting: Physical Therapy

## 2017-05-30 ENCOUNTER — Ambulatory Visit: Payer: 59 | Admitting: Physical Therapy

## 2017-05-30 DIAGNOSIS — M5440 Lumbago with sciatica, unspecified side: Secondary | ICD-10-CM

## 2017-05-30 DIAGNOSIS — M5416 Radiculopathy, lumbar region: Secondary | ICD-10-CM
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
## 2017-06-26 ENCOUNTER — Other Ambulatory Visit: Payer: Self-pay | Admitting: Neurosurgery

## 2017-07-02 ENCOUNTER — Ambulatory Visit (HOSPITAL_COMMUNITY): Payer: 59 | Admitting: Psychiatry

## 2017-07-03 ENCOUNTER — Ambulatory Visit (INDEPENDENT_AMBULATORY_CARE_PROVIDER_SITE_OTHER): Payer: 59 | Admitting: Psychiatry

## 2017-07-03 ENCOUNTER — Encounter (HOSPITAL_COMMUNITY): Payer: Self-pay | Admitting: Psychiatry

## 2017-07-03 VITALS — BP 126/86 | HR 80 | Ht 71.0 in | Wt 239.0 lb

## 2017-07-03 DIAGNOSIS — F3162 Bipolar disorder, current episode mixed, moderate: Secondary | ICD-10-CM | POA: Diagnosis not present

## 2017-07-03 MED ORDER — ALPRAZOLAM 1 MG PO TABS: 1.0000 mg | ORAL_TABLET | Freq: Four times a day (QID) | ORAL | 2 refills | Status: DC

## 2017-07-03 MED ORDER — DULOXETINE HCL 60 MG PO CPEP: 60.0000 mg | ORAL_CAPSULE | Freq: Two times a day (BID) | ORAL | 2 refills | Status: DC

## 2017-07-03 MED ORDER — LURASIDONE HCL 120 MG PO TABS: ORAL_TABLET | ORAL | 2 refills | Status: DC
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
## 2017-07-04 NOTE — Pre-Procedure Instructions (Signed)
Glen Jacobson  07/04/2017      [id number] - [place], [place] - [address] [place] [address] Phone: [phone] Fax: [phone]    Your procedure is scheduled on Wed., Jul 11, 2017  Report to [facility] Admitting Entrance "A" at 6:30AM  Call this number if you have problems the morning of surgery:  [phone]   Remember:  Do not eat food or drink liquids after midnight.  Take these medicines the morning of surgery with A SIP OF WATER: ALPRAZolam Prudy Feeler), DULoxetine (CYMBALTA), and Gabapentin (NEURONTIN)  If needed EPINEPHrine Pen for an allergic reaction  As of today, stop taking all Aspirins, Vitamins, Fish oils, and Herbal medications. Also stop all NSAIDS i.e. Advil, Ibuprofen, Motrin, Aleve, Anaprox, Naproxen, BC and Goody Powders. Including: Aspirin-acetaminophen-caffeine (EXCEDRIN MIGRAINE)  How to Manage Your Diabetes Before and After Surgery  Why is it important to control my blood sugar before and after surgery? . Improving blood sugar levels before and after surgery helps healing and can limit problems. . A way of improving blood sugar control is eating a healthy diet by: o  Eating less sugar and carbohydrates o  Increasing activity/exercise o  Talking with your doctor about reaching your blood sugar goals . High blood sugars (greater than 180 mg/dL) can raise your risk of infections and slow your recovery, so you will need to focus on controlling your diabetes during the weeks before surgery. . Make sure that the doctor who takes care of your diabetes knows about your planned surgery including the date and location.  How do I manage my blood sugar before surgery? . Check your blood sugar at least 4 times a day, starting 2 days before surgery, to make sure that the level is not too high or low. o Check your blood sugar the morning of your surgery when you wake up and every 2 hours until you get to the Short Stay  unit. . If your blood sugar is less than 70 mg/dL, you will need to treat for low blood sugar: o Do not take insulin. o Treat a low blood sugar (less than 70 mg/dL) with  cup of clear juice (cranberry or apple), 4 glucose tablets, OR glucose gel. Recheck blood sugar in 15 minutes after treatment (to make sure it is greater than 70 mg/dL). If your blood sugar is not greater than 70 mg/dL on recheck, call [phone] o  for further instructions. . Report your blood sugar to the short stay nurse when you get to Short Stay.  . If you are admitted to the hospital after surgery: o Your blood sugar will be checked by the staff and you will probably be given insulin after surgery (instead of oral diabetes medicines) to make sure you have good blood sugar levels. o The goal for blood sugar control after surgery is 80-180 mg/dL.  WHAT DO I DO ABOUT MY DIABETES MEDICATION?  Marland Kitchen Do not take MetFORMIN (GLUCOPHAGE) the morning of surgery.  . If your CBG is greater than 220 mg/dL, call us at [phone]   Do not wear jewelry.  Do not wear lotions, powders, colognes, or deodorant.  Do not shave 48 hours prior to surgery.  Men may shave face.  Do not bring valuables to the hospital.  [facility] is not responsible for any belongings or valuables.  Contacts, dentures or bridgework may not be worn into surgery.  Leave your suitcase in the car.  After surgery  it may be brought to your room.  For patients admitted to the hospital, discharge time will be determined by your treatment team.  Patients discharged the day of surgery will not be allowed to drive home.   Special instructions:   [facility]- Preparing For Surgery  Before surgery, you can play an important role. Because skin is not sterile, your skin needs to be as free of germs as possible. You can reduce the number of germs on your skin by washing with CHG (chlorahexidine gluconate) Soap before surgery.  CHG is an antiseptic cleaner which kills  germs and bonds with the skin to continue killing germs even after washing.  Please do not use if you have an allergy to CHG or antibacterial soaps. If your skin becomes reddened/irritated stop using the CHG.  Do not shave (including legs and underarms) for at least 48 hours prior to first CHG shower. It is OK to shave your face.  Please follow these instructions carefully.   1. Shower the NIGHT BEFORE SURGERY and the MORNING OF SURGERY with CHG.   2. If you chose to wash your hair, wash your hair first as usual with your normal shampoo.  3. After you shampoo, rinse your hair and body thoroughly to remove the shampoo.  4. Use CHG as you would any other liquid soap. You can apply CHG directly to the skin and wash gently with a scrungie or a clean washcloth.   5. Apply the CHG Soap to your body ONLY FROM THE NECK DOWN.  Do not use on open wounds or open sores. Avoid contact with your eyes, ears, mouth and genitals (private parts). Wash Face and genitals (private parts)  with your normal soap.  6. Wash thoroughly, paying special attention to the area where your surgery will be performed.  7. Thoroughly rinse your body with warm water from the neck down.  8. DO NOT shower/wash with your normal soap after using and rinsing off the CHG Soap.  9. Pat yourself dry with a CLEAN TOWEL.  10. Wear CLEAN PAJAMAS to bed the night before surgery, wear comfortable clothes the morning of surgery  11. Place CLEAN SHEETS on your bed the night of your first shower and DO NOT SLEEP WITH PETS.  Day of Surgery: Do not apply any deodorants/lotions. Please wear clean clothes to the hospital/surgery center.    Please read over the following fact sheets that you were given. Pain Booklet, Coughing and Deep Breathing, MRSA Information and Surgical Site Infection Prevention

## 2017-07-05 ENCOUNTER — Other Ambulatory Visit: Payer: Self-pay

## 2017-07-05 ENCOUNTER — Encounter (HOSPITAL_COMMUNITY)
Admission: RE | Admit: 2017-07-05 | Discharge: 2017-07-05 | Disposition: A | Discharge status: Home / Self Care | Payer: Managed Care, Other (non HMO) | Source: Ambulatory Visit | Attending: Neurosurgery | Admitting: Neurosurgery

## 2017-07-05 ENCOUNTER — Encounter (HOSPITAL_COMMUNITY): Payer: Self-pay

## 2017-07-05 DIAGNOSIS — Z01812 Encounter for preprocedural laboratory examination: Secondary | ICD-10-CM | POA: Insufficient documentation

## 2017-07-05 HISTORY — DX: Pneumonia, unspecified organism: J18.9

## 2017-07-05 HISTORY — DX: Anxiety disorder, unspecified: F41.9

## 2017-07-05 LAB — BASIC METABOLIC PANEL
Anion gap: 7 (ref 5–15)
BUN: 5 mg/dL — ABNORMAL LOW (ref 6–20)
CALCIUM: 9.5 mg/dL (ref 8.9–10.3)
CHLORIDE: 103 mmol/L (ref 101–111)
CO2: 28 mmol/L (ref 22–32)
CREATININE: 1.02 mg/dL (ref 0.61–1.24)
GFR calc non Af Amer: >60 mL/min (ref >60)
Glucose, Bld: 95 mg/dL (ref 65–99)
Potassium: 4.1 mmol/L (ref 3.5–5.1)
SODIUM: 138 mmol/L (ref 135–145)

## 2017-07-05 LAB — CBC
HEMATOCRIT: 49.2 % (ref 39.0–52.0)
Hemoglobin: 16.2 g/dL (ref 13.0–17.0)
MCH: 30.4 pg (ref 26.0–34.0)
MCHC: 32.9 g/dL (ref 30.0–36.0)
MCV: 92.3 fL (ref 78.0–100.0)
PLATELETS: 374 10*3/uL (ref 150–400)
RBC: 5.33 MIL/uL (ref 4.22–5.81)
RDW: 13.7 % (ref 11.5–15.5)
WBC: 9.8 10*3/uL (ref 4.0–10.5)

## 2017-07-05 LAB — HEMOGLOBIN A1C
HEMOGLOBIN A1C: 5.7 % — AB (ref 4.8–5.6)
MEAN PLASMA GLUCOSE: 116.89 mg/dL

## 2017-07-05 LAB — SURGICAL PCR SCREEN
MRSA, PCR: NEGATIVE
Staphylococcus aureus: NEGATIVE

## 2017-07-05 LAB — TYPE AND SCREEN
ABO/RH(D): A POS
Antibody Screen: NEGATIVE

## 2017-07-05 NOTE — Progress Notes (Signed)
[facility] is [facility] in [place]  Denies seeing a cardiologist Denies chest pain, cough, or fever States that he doesn't check his CBG's and that he is boarder line Denies ever having a card cath, stress test, or echo.

## 2017-07-10 MED ORDER — SODIUM CHLORIDE 0.9 % IV SOLN
1500.0000 mg | INTRAVENOUS | Status: AC
  Administered 2017-07-11 (×2): 1500 mg via INTRAVENOUS
  Filled 2017-07-10: qty 1500

## 2017-07-10 NOTE — Anesthesia Preprocedure Evaluation (Addendum)
Anesthesia Evaluation  Patient identified by MRN, date of birth, ID band Patient awake    Reviewed: Allergy & Precautions, NPO status , Patient's Chart, lab work & pertinent test results  History of Anesthesia Complications (+) PONV and history of anesthetic complications  Airway Mallampati: II  TM Distance: >3 FB Neck ROM: Full    Dental no notable dental hx. (+) Dental Advisory Given   Pulmonary neg pulmonary ROS, Current Smoker,    Pulmonary exam normal        Cardiovascular hypertension, Normal cardiovascular exam     Neuro/Psych PSYCHIATRIC DISORDERS Anxiety Bipolar Disorder    GI/Hepatic negative GI ROS, Neg liver ROS,   Endo/Other  diabetesHypothyroidism   Renal/GU negative Renal ROS     Musculoskeletal negative musculoskeletal ROS (+)   Abdominal   Peds  Hematology negative hematology ROS (+)   Anesthesia Other Findings Day of surgery medications reviewed with the patient.  Reproductive/Obstetrics                           Anesthesia Physical Anesthesia Plan  ASA: III  Anesthesia Plan: General   Post-op Pain Management:    Induction: Intravenous  PONV Risk Score and Plan: 3 and Ondansetron, Dexamethasone and Scopolamine patch - Pre-op  Airway Management Planned: Oral ETT  Additional Equipment:   Intra-op Plan:   Post-operative Plan: Extubation in OR  Informed Consent: I have reviewed the patients History and Physical, chart, labs and discussed the procedure including the risks, benefits and alternatives for the proposed anesthesia with the patient or authorized representative who has indicated his/her understanding and acceptance.   Dental advisory given  Plan Discussed with: CRNA, Anesthesiologist and Surgeon  Anesthesia Plan Comments: (Ketamine)      Anesthesia Quick Evaluation

## 2017-07-11 ENCOUNTER — Encounter (HOSPITAL_COMMUNITY): Payer: Self-pay | Admitting: *Deleted

## 2017-07-11 ENCOUNTER — Inpatient Hospital Stay (HOSPITAL_COMMUNITY): Payer: 59

## 2017-07-11 ENCOUNTER — Inpatient Hospital Stay (HOSPITAL_COMMUNITY)
Admission: RE | Admit: 2017-07-11 | Discharge: 2017-07-12 | DRG: 460 | Disposition: A | Discharge status: Home / Self Care | Payer: 59 | Source: Ambulatory Visit | Attending: Neurosurgery | Admitting: Neurosurgery

## 2017-07-11 ENCOUNTER — Inpatient Hospital Stay (HOSPITAL_COMMUNITY)
Admission: RE | Disposition: A | Discharge status: Home / Self Care | Payer: Self-pay | Source: Ambulatory Visit | Attending: Neurosurgery

## 2017-07-11 ENCOUNTER — Inpatient Hospital Stay (HOSPITAL_COMMUNITY): Payer: 59 | Admitting: Anesthesiology

## 2017-07-11 DIAGNOSIS — Z885 Allergy status to narcotic agent status: Secondary | ICD-10-CM

## 2017-07-11 DIAGNOSIS — E039 Hypothyroidism, unspecified: Secondary | ICD-10-CM | POA: Diagnosis present

## 2017-07-11 DIAGNOSIS — Z79890 Hormone replacement therapy (postmenopausal): Secondary | ICD-10-CM

## 2017-07-11 DIAGNOSIS — F17290 Nicotine dependence, other tobacco product, uncomplicated: Secondary | ICD-10-CM | POA: Diagnosis present

## 2017-07-11 DIAGNOSIS — M5116 Intervertebral disc disorders with radiculopathy, lumbar region: Principal | ICD-10-CM | POA: Diagnosis present

## 2017-07-11 DIAGNOSIS — I10 Essential (primary) hypertension: Secondary | ICD-10-CM | POA: Diagnosis present

## 2017-07-11 DIAGNOSIS — Z881 Allergy status to other antibiotic agents status: Secondary | ICD-10-CM

## 2017-07-11 DIAGNOSIS — L905 Scar conditions and fibrosis of skin: Secondary | ICD-10-CM | POA: Diagnosis present

## 2017-07-11 DIAGNOSIS — Z880 Allergy status to penicillin: Secondary | ICD-10-CM

## 2017-07-11 DIAGNOSIS — Z87442 Personal history of urinary calculi: Secondary | ICD-10-CM | POA: Diagnosis not present

## 2017-07-11 DIAGNOSIS — Z79899 Other long term (current) drug therapy: Secondary | ICD-10-CM

## 2017-07-11 DIAGNOSIS — Z419 Encounter for procedure for purposes other than remedying health state, unspecified: Secondary | ICD-10-CM

## 2017-07-11 DIAGNOSIS — E119 Type 2 diabetes mellitus without complications: Secondary | ICD-10-CM | POA: Diagnosis present

## 2017-07-11 DIAGNOSIS — M5126 Other intervertebral disc displacement, lumbar region: Secondary | ICD-10-CM | POA: Diagnosis present

## 2017-07-11 DIAGNOSIS — M4726 Other spondylosis with radiculopathy, lumbar region: Secondary | ICD-10-CM | POA: Diagnosis present

## 2017-07-11 DIAGNOSIS — F419 Anxiety disorder, unspecified: Secondary | ICD-10-CM | POA: Diagnosis present

## 2017-07-11 DIAGNOSIS — Z7982 Long term (current) use of aspirin: Secondary | ICD-10-CM

## 2017-07-11 DIAGNOSIS — F319 Bipolar disorder, unspecified: Secondary | ICD-10-CM | POA: Diagnosis present

## 2017-07-11 LAB — GLUCOSE, CAPILLARY
Glucose-Capillary: 124 mg/dL — ABNORMAL HIGH (ref 65–99)
Glucose-Capillary: 185 mg/dL — ABNORMAL HIGH (ref 65–99)
Glucose-Capillary: 294 mg/dL — ABNORMAL HIGH (ref 65–99)

## 2017-07-11 SURGERY — POSTERIOR LUMBAR FUSION 1 LEVEL
Anesthesia: General | Site: Back | Laterality: Bilateral

## 2017-07-11 MED ORDER — HYDROMORPHONE HCL 1 MG/ML IJ SOLN
INTRAMUSCULAR | Status: AC
  Filled 2017-07-11: qty 0.5

## 2017-07-11 MED ORDER — SCOPOLAMINE 1 MG/3DAYS TD PT72
1.0000 | MEDICATED_PATCH | TRANSDERMAL | Status: DC
  Administered 2017-07-11: 1 via TRANSDERMAL
  Administered 2017-07-11: 1.5 mg via TRANSDERMAL
  Filled 2017-07-11: qty 1

## 2017-07-11 MED ORDER — KETAMINE HCL 10 MG/ML IJ SOLN
INTRAMUSCULAR | Status: DC | PRN
  Administered 2017-07-11 (×4): 10 mg via INTRAVENOUS
  Administered 2017-07-11: 50 mg via INTRAVENOUS
  Administered 2017-07-11: 10 mg via INTRAVENOUS

## 2017-07-11 MED ORDER — SODIUM CHLORIDE 0.9 % IV SOLN: 250.0000 mL | INTRAVENOUS | Status: DC

## 2017-07-11 MED ORDER — ARTIFICIAL TEARS OPHTHALMIC OINT
TOPICAL_OINTMENT | OPHTHALMIC | Status: DC | PRN
  Administered 2017-07-11: 1 via OPHTHALMIC

## 2017-07-11 MED ORDER — LIDOCAINE-EPINEPHRINE 1 %-1:100000 IJ SOLN
INTRAMUSCULAR | Status: AC
  Filled 2017-07-11: qty 1

## 2017-07-11 MED ORDER — PHENOL 1.4 % MT LIQD: 1.0000 | OROMUCOSAL | Status: DC | PRN

## 2017-07-11 MED ORDER — THROMBIN 20000 UNITS EX SOLR
CUTANEOUS | Status: AC
  Filled 2017-07-11: qty 20000

## 2017-07-11 MED ORDER — ACETAMINOPHEN 650 MG RE SUPP: 650.0000 mg | RECTAL | Status: DC | PRN

## 2017-07-11 MED ORDER — METFORMIN HCL 500 MG PO TABS
500.0000 mg | ORAL_TABLET | Freq: Every day | ORAL | Status: DC
  Administered 2017-07-11: 500 mg via ORAL
  Filled 2017-07-11: qty 1

## 2017-07-11 MED ORDER — CYCLOBENZAPRINE HCL 5 MG PO TABS
5.0000 mg | ORAL_TABLET | Freq: Three times a day (TID) | ORAL | Status: DC | PRN
  Administered 2017-07-11: 10 mg via ORAL
  Filled 2017-07-11: qty 2

## 2017-07-11 MED ORDER — GENTAMICIN IN SALINE 1.6-0.9 MG/ML-% IV SOLN: 80.0000 mg | Freq: Once | INTRAVENOUS | Status: DC

## 2017-07-11 MED ORDER — ATORVASTATIN CALCIUM 40 MG PO TABS
40.0000 mg | ORAL_TABLET | Freq: Every day | ORAL | Status: DC
  Administered 2017-07-11: 40 mg via ORAL
  Filled 2017-07-11: qty 2
  Filled 2017-07-11: qty 1

## 2017-07-11 MED ORDER — FENTANYL CITRATE (PF) 250 MCG/5ML IJ SOLN
INTRAMUSCULAR | Status: AC
  Filled 2017-07-11: qty 5

## 2017-07-11 MED ORDER — DEXAMETHASONE SODIUM PHOSPHATE 10 MG/ML IJ SOLN
INTRAMUSCULAR | Status: AC
  Filled 2017-07-11: qty 1

## 2017-07-11 MED ORDER — LIDOCAINE-EPINEPHRINE 1 %-1:100000 IJ SOLN
INTRAMUSCULAR | Status: DC | PRN
  Administered 2017-07-11: 15 mL

## 2017-07-11 MED ORDER — MORPHINE SULFATE (PF) 4 MG/ML IV SOLN: 4.0000 mg | INTRAVENOUS | Status: DC | PRN

## 2017-07-11 MED ORDER — ONDANSETRON HCL 4 MG/2ML IJ SOLN
INTRAMUSCULAR | Status: AC
  Filled 2017-07-11: qty 2

## 2017-07-11 MED ORDER — INSULIN ASPART 100 UNIT/ML ~~LOC~~ SOLN
0.0000 [IU] | Freq: Every day | SUBCUTANEOUS | Status: DC
  Administered 2017-07-11: 3 [IU] via SUBCUTANEOUS

## 2017-07-11 MED ORDER — VITAMIN D 1000 UNITS PO TABS
5000.0000 [IU] | ORAL_TABLET | Freq: Every day | ORAL | Status: DC
  Administered 2017-07-12: 5000 [IU] via ORAL
  Filled 2017-07-11: qty 5

## 2017-07-11 MED ORDER — HYDROCODONE-ACETAMINOPHEN 5-325 MG PO TABS
1.0000 | ORAL_TABLET | ORAL | Status: DC | PRN
  Administered 2017-07-11: 1 via ORAL
  Administered 2017-07-11: 2 via ORAL
  Administered 2017-07-11: 1 via ORAL
  Administered 2017-07-12 (×2): 2 via ORAL
  Filled 2017-07-11: qty 1
  Filled 2017-07-11 (×3): qty 2
  Filled 2017-07-11: qty 1

## 2017-07-11 MED ORDER — ACETAMINOPHEN 325 MG PO TABS: 650.0000 mg | ORAL_TABLET | ORAL | Status: DC | PRN

## 2017-07-11 MED ORDER — KETAMINE HCL 10 MG/ML IJ SOLN
INTRAMUSCULAR | Status: AC
  Filled 2017-07-11: qty 1

## 2017-07-11 MED ORDER — BISACODYL 10 MG RE SUPP: 10.0000 mg | Freq: Every day | RECTAL | Status: DC | PRN

## 2017-07-11 MED ORDER — DEXAMETHASONE SODIUM PHOSPHATE 10 MG/ML IJ SOLN
INTRAMUSCULAR | Status: DC | PRN
  Administered 2017-07-11: 10 mg via INTRAVENOUS

## 2017-07-11 MED ORDER — LACTATED RINGERS IV SOLN
INTRAVENOUS | Status: DC
Start: 2017-07-11 — End: 2017-07-12
  Administered 2017-07-11 (×2): via INTRAVENOUS

## 2017-07-11 MED ORDER — FENTANYL CITRATE (PF) 100 MCG/2ML IJ SOLN
INTRAMUSCULAR | Status: DC | PRN
  Administered 2017-07-11: 100 ug via INTRAVENOUS
  Administered 2017-07-11: 50 ug via INTRAVENOUS
  Administered 2017-07-11: 100 ug via INTRAVENOUS
  Administered 2017-07-11: 50 ug via INTRAVENOUS

## 2017-07-11 MED ORDER — BUPIVACAINE HCL (PF) 0.5 % IJ SOLN
INTRAMUSCULAR | Status: DC | PRN
  Administered 2017-07-11: 15 mL

## 2017-07-11 MED ORDER — HYDROXYZINE HCL 25 MG PO TABS: 50.0000 mg | ORAL_TABLET | ORAL | Status: DC | PRN

## 2017-07-11 MED ORDER — INSULIN ASPART 100 UNIT/ML ~~LOC~~ SOLN
0.0000 [IU] | Freq: Three times a day (TID) | SUBCUTANEOUS | Status: DC
  Administered 2017-07-12: 3 [IU] via SUBCUTANEOUS

## 2017-07-11 MED ORDER — DULOXETINE HCL 60 MG PO CPEP
60.0000 mg | ORAL_CAPSULE | Freq: Two times a day (BID) | ORAL | Status: DC
  Administered 2017-07-11 – 2017-07-12 (×2): 60 mg via ORAL
  Filled 2017-07-11 (×2): qty 2
  Filled 2017-07-11 (×3): qty 1

## 2017-07-11 MED ORDER — FENTANYL CITRATE (PF) 100 MCG/2ML IJ SOLN: 25.0000 ug | INTRAMUSCULAR | Status: DC | PRN

## 2017-07-11 MED ORDER — ARTIFICIAL TEARS OPHTHALMIC OINT
TOPICAL_OINTMENT | OPHTHALMIC | Status: AC
  Filled 2017-07-11: qty 3.5

## 2017-07-11 MED ORDER — DEXTROSE 5 % IV SOLN
INTRAVENOUS | Status: DC | PRN
  Administered 2017-07-11: 80 mg via INTRAVENOUS

## 2017-07-11 MED ORDER — CHLORHEXIDINE GLUCONATE CLOTH 2 % EX PADS: 6.0000 | MEDICATED_PAD | Freq: Once | CUTANEOUS | Status: DC

## 2017-07-11 MED ORDER — VECURONIUM BROMIDE 10 MG IV SOLR
INTRAVENOUS | Status: DC | PRN
  Administered 2017-07-11 (×2): 2.5 mg via INTRAVENOUS
  Administered 2017-07-11: 5 mg via INTRAVENOUS

## 2017-07-11 MED ORDER — NEOSTIGMINE METHYLSULFATE 5 MG/5ML IV SOSY
PREFILLED_SYRINGE | INTRAVENOUS | Status: DC | PRN
  Administered 2017-07-11: 4 mg via INTRAVENOUS

## 2017-07-11 MED ORDER — BUPIVACAINE HCL (PF) 0.5 % IJ SOLN
INTRAMUSCULAR | Status: AC
  Filled 2017-07-11: qty 30

## 2017-07-11 MED ORDER — LURASIDONE HCL 60 MG PO TABS
120.0000 mg | ORAL_TABLET | Freq: Every day | ORAL | Status: DC
  Filled 2017-07-11: qty 2

## 2017-07-11 MED ORDER — FLEET ENEMA 7-19 GM/118ML RE ENEM: 1.0000 | ENEMA | Freq: Once | RECTAL | Status: DC | PRN

## 2017-07-11 MED ORDER — HYDROXYZINE HCL 50 MG/ML IM SOLN: 50.0000 mg | INTRAMUSCULAR | Status: DC | PRN

## 2017-07-11 MED ORDER — KETOROLAC TROMETHAMINE 30 MG/ML IJ SOLN: 30.0000 mg | Freq: Once | INTRAMUSCULAR | Status: DC

## 2017-07-11 MED ORDER — ONDANSETRON HCL 4 MG/2ML IJ SOLN
INTRAMUSCULAR | Status: DC | PRN
  Administered 2017-07-11: 4 mg via INTRAVENOUS

## 2017-07-11 MED ORDER — GENTAMICIN SULFATE 40 MG/ML IJ SOLN: Freq: Once | INTRAMUSCULAR | Status: DC

## 2017-07-11 MED ORDER — THROMBIN 5000 UNITS EX SOLR
CUTANEOUS | Status: AC
  Filled 2017-07-11: qty 5000

## 2017-07-11 MED ORDER — PROPOFOL 10 MG/ML IV BOLUS
INTRAVENOUS | Status: AC
  Filled 2017-07-11: qty 40

## 2017-07-11 MED ORDER — ACETAMINOPHEN 10 MG/ML IV SOLN
INTRAVENOUS | Status: DC | PRN
  Administered 2017-07-11: 1000 mg via INTRAVENOUS

## 2017-07-11 MED ORDER — HYDROMORPHONE HCL 1 MG/ML IJ SOLN
INTRAMUSCULAR | Status: DC | PRN
  Administered 2017-07-11 (×2): 0.5 mg via INTRAVENOUS

## 2017-07-11 MED ORDER — MIDAZOLAM HCL 2 MG/2ML IJ SOLN
INTRAMUSCULAR | Status: AC
  Filled 2017-07-11: qty 2

## 2017-07-11 MED ORDER — ACETAMINOPHEN 10 MG/ML IV SOLN
INTRAVENOUS | Status: AC
  Filled 2017-07-11: qty 100

## 2017-07-11 MED ORDER — POTASSIUM CHLORIDE IN NACL 20-0.9 MEQ/L-% IV SOLN: INTRAVENOUS | Status: DC

## 2017-07-11 MED ORDER — LEVOTHYROXINE SODIUM 88 MCG PO TABS
88.0000 ug | ORAL_TABLET | Freq: Every day | ORAL | Status: DC
  Administered 2017-07-11: 88 ug via ORAL
  Filled 2017-07-11: qty 1

## 2017-07-11 MED ORDER — PROPOFOL 10 MG/ML IV BOLUS
INTRAVENOUS | Status: DC | PRN
  Administered 2017-07-11: 200 mg via INTRAVENOUS

## 2017-07-11 MED ORDER — PROMETHAZINE HCL 25 MG/ML IJ SOLN: 6.2500 mg | INTRAMUSCULAR | Status: DC | PRN

## 2017-07-11 MED ORDER — ALPRAZOLAM 0.5 MG PO TABS
1.0000 mg | ORAL_TABLET | Freq: Four times a day (QID) | ORAL | Status: DC
  Administered 2017-07-11 – 2017-07-12 (×3): 1 mg via ORAL
  Filled 2017-07-11 (×3): qty 2

## 2017-07-11 MED ORDER — LIDOCAINE 2% (20 MG/ML) 5 ML SYRINGE
INTRAMUSCULAR | Status: DC | PRN
  Administered 2017-07-11: 100 mg via INTRAVENOUS

## 2017-07-11 MED ORDER — VECURONIUM BROMIDE 10 MG IV SOLR
INTRAVENOUS | Status: AC
  Filled 2017-07-11: qty 10

## 2017-07-11 MED ORDER — GLYCOPYRROLATE 0.2 MG/ML IV SOSY
PREFILLED_SYRINGE | INTRAVENOUS | Status: DC | PRN
  Administered 2017-07-11: 0.6 mg via INTRAVENOUS

## 2017-07-11 MED ORDER — SODIUM CHLORIDE 0.9% FLUSH: 3.0000 mL | INTRAVENOUS | Status: DC | PRN

## 2017-07-11 MED ORDER — GENTAMICIN IN SALINE 1.6-0.9 MG/ML-% IV SOLN
INTRAVENOUS | Status: AC
  Filled 2017-07-11: qty 50

## 2017-07-11 MED ORDER — SODIUM CHLORIDE 0.9% FLUSH
3.0000 mL | Freq: Two times a day (BID) | INTRAVENOUS | Status: DC
  Administered 2017-07-11: 3 mL via INTRAVENOUS

## 2017-07-11 MED ORDER — ROCURONIUM BROMIDE 10 MG/ML (PF) SYRINGE
PREFILLED_SYRINGE | INTRAVENOUS | Status: DC | PRN
Start: 2017-07-11 — End: 2017-07-11
  Administered 2017-07-11: 50 mg via INTRAVENOUS

## 2017-07-11 MED ORDER — THROMBIN (RECOMBINANT) 5000 UNITS EX SOLR
CUTANEOUS | Status: DC | PRN
  Administered 2017-07-11: 10:00:00 via TOPICAL

## 2017-07-11 MED ORDER — KETOROLAC TROMETHAMINE 30 MG/ML IJ SOLN
30.0000 mg | Freq: Four times a day (QID) | INTRAMUSCULAR | Status: DC
  Administered 2017-07-11 – 2017-07-12 (×3): 30 mg via INTRAVENOUS
  Filled 2017-07-11 (×3): qty 1

## 2017-07-11 MED ORDER — MENTHOL 3 MG MT LOZG
1.0000 | LOZENGE | OROMUCOSAL | Status: DC | PRN
Start: 2017-07-11 — End: 2017-07-12

## 2017-07-11 MED ORDER — MIDAZOLAM HCL 5 MG/5ML IJ SOLN
INTRAMUSCULAR | Status: DC | PRN
  Administered 2017-07-11: 2 mg via INTRAVENOUS

## 2017-07-11 MED ORDER — 0.9 % SODIUM CHLORIDE (POUR BTL) OPTIME
TOPICAL | Status: DC | PRN
  Administered 2017-07-11: 1000 mL

## 2017-07-11 MED ORDER — MAGNESIUM HYDROXIDE 400 MG/5ML PO SUSP
30.0000 mL | Freq: Every day | ORAL | Status: DC | PRN
Start: 2017-07-11 — End: 2017-07-12

## 2017-07-11 MED ORDER — ROCURONIUM BROMIDE 50 MG/5ML IV SOLN
INTRAVENOUS | Status: AC
  Filled 2017-07-11: qty 1

## 2017-07-11 MED ORDER — ALUM & MAG HYDROXIDE-SIMETH 200-200-20 MG/5ML PO SUSP: 30.0000 mL | Freq: Four times a day (QID) | ORAL | Status: DC | PRN

## 2017-07-11 MED ORDER — LIDOCAINE 2% (20 MG/ML) 5 ML SYRINGE
INTRAMUSCULAR | Status: AC
  Filled 2017-07-11: qty 5

## 2017-07-11 MED ORDER — NEOSTIGMINE METHYLSULFATE 5 MG/5ML IV SOSY
PREFILLED_SYRINGE | INTRAVENOUS | Status: AC
  Filled 2017-07-11: qty 5

## 2017-07-11 MED ORDER — SURGIFOAM 100 EX MISC
CUTANEOUS | Status: DC | PRN
  Administered 2017-07-11: 09:00:00 via TOPICAL

## 2017-07-11 MED ORDER — GABAPENTIN 600 MG PO TABS
600.0000 mg | ORAL_TABLET | Freq: Two times a day (BID) | ORAL | Status: DC
  Administered 2017-07-11 – 2017-07-12 (×2): 600 mg via ORAL
  Filled 2017-07-11 (×2): qty 1

## 2017-07-11 MED ORDER — SODIUM CHLORIDE 0.9 % IV SOLN
INTRAVENOUS | Status: DC | PRN
  Administered 2017-07-11 (×2)

## 2017-07-11 SURGICAL SUPPLY — 71 items
ADH SKN CLS APL DERMABOND .7 (GAUZE/BANDAGES/DRESSINGS) ×2
APL SKNCLS STERI-STRIP NONHPOA (GAUZE/BANDAGES/DRESSINGS) ×1
BAG DECANTER FOR FLEXI CONT (MISCELLANEOUS) ×3 IMPLANT
BENZOIN TINCTURE PRP APPL 2/3 (GAUZE/BANDAGES/DRESSINGS) ×2 IMPLANT
BLADE CLIPPER SURG (BLADE) IMPLANT
BUR ACRON 5.0MM COATED (BURR) ×2 IMPLANT
BUR MATCHSTICK NEURO 3.0 LAGG (BURR) ×2 IMPLANT
CANISTER SUCT 3000ML PPV (MISCELLANEOUS) ×2 IMPLANT
CAP LCK SPNE (Orthopedic Implant) ×4 IMPLANT
CAP LOCK SPINE RADIUS (Orthopedic Implant) IMPLANT
CAP LOCKING (Orthopedic Implant) ×8 IMPLANT
CARTRIDGE OIL MAESTRO DRILL (MISCELLANEOUS) ×1 IMPLANT
CONT SPEC 4OZ CLIKSEAL STRL BL (MISCELLANEOUS) ×2 IMPLANT
COVER BACK TABLE 60X90IN (DRAPES) ×2 IMPLANT
DECANTER SPIKE VIAL GLASS SM (MISCELLANEOUS) ×2 IMPLANT
DERMABOND ADVANCED (GAUZE/BANDAGES/DRESSINGS) ×2
DERMABOND ADVANCED .7 DNX12 (GAUZE/BANDAGES/DRESSINGS) ×1 IMPLANT
DIFFUSER DRILL AIR PNEUMATIC (MISCELLANEOUS) ×2 IMPLANT
DRAPE C-ARM 42X72 X-RAY (DRAPES) ×3 IMPLANT
DRAPE C-ARMOR (DRAPES) ×1 IMPLANT
DRAPE HALF SHEET 40X57 (DRAPES) IMPLANT
DRAPE LAPAROTOMY 100X72X124 (DRAPES) ×2 IMPLANT
DRAPE POUCH INSTRU U-SHP 10X18 (DRAPES) ×1 IMPLANT
ELECT REM PT RETURN 9FT ADLT (ELECTROSURGICAL) ×2
ELECTRODE REM PT RTRN 9FT ADLT (ELECTROSURGICAL) ×1 IMPLANT
GAUZE SPONGE 4X4 12PLY STRL (GAUZE/BANDAGES/DRESSINGS) ×2 IMPLANT
GAUZE SPONGE 4X4 12PLY STRL LF (GAUZE/BANDAGES/DRESSINGS) ×1 IMPLANT
GAUZE SPONGE 4X4 16PLY XRAY LF (GAUZE/BANDAGES/DRESSINGS) IMPLANT
GLOVE BIOGEL PI IND STRL 8 (GLOVE) ×2 IMPLANT
GLOVE BIOGEL PI INDICATOR 8 (GLOVE) ×2
GLOVE ECLIPSE 7.5 STRL STRAW (GLOVE) ×4 IMPLANT
GOWN STRL REUS W/ TWL LRG LVL3 (GOWN DISPOSABLE) IMPLANT
GOWN STRL REUS W/ TWL XL LVL3 (GOWN DISPOSABLE) ×2 IMPLANT
GOWN STRL REUS W/TWL 2XL LVL3 (GOWN DISPOSABLE) IMPLANT
GOWN STRL REUS W/TWL LRG LVL3 (GOWN DISPOSABLE)
GOWN STRL REUS W/TWL XL LVL3 (GOWN DISPOSABLE) ×4
HEMOSTAT POWDER KIT SURGIFOAM (HEMOSTASIS) ×1 IMPLANT
KIT BASIN OR (CUSTOM PROCEDURE TRAY) ×2 IMPLANT
KIT INFUSE X SMALL 1.4CC (Orthopedic Implant) ×1 IMPLANT
KIT TURNOVER KIT B (KITS) ×2 IMPLANT
NDL ASP BONE MRW 8GX15 (NEEDLE) IMPLANT
NDL SPNL 18GX3.5 QUINCKE PK (NEEDLE) ×1 IMPLANT
NDL SPNL 22GX3.5 QUINCKE BK (NEEDLE) ×1 IMPLANT
NEEDLE ASP BONE MRW 8GX15 (NEEDLE) ×2 IMPLANT
NEEDLE SPNL 18GX3.5 QUINCKE PK (NEEDLE) ×2 IMPLANT
NEEDLE SPNL 22GX3.5 QUINCKE BK (NEEDLE) ×2 IMPLANT
NS IRRIG 1000ML POUR BTL (IV SOLUTION) ×2 IMPLANT
OIL CARTRIDGE MAESTRO DRILL (MISCELLANEOUS) ×2
PACK LAMINECTOMY NEURO (CUSTOM PROCEDURE TRAY) ×2 IMPLANT
PAD ARMBOARD 7.5X6 YLW CONV (MISCELLANEOUS) ×6 IMPLANT
PATTIES SURGICAL .5 X.5 (GAUZE/BANDAGES/DRESSINGS) IMPLANT
PATTIES SURGICAL .5 X1 (DISPOSABLE) ×1 IMPLANT
PATTIES SURGICAL 1X1 (DISPOSABLE) IMPLANT
ROD RADIUS 35MM (Rod) ×2 IMPLANT
SCREW 5.75X45MM (Screw) ×4 IMPLANT
SPACER 8X25X11 POST 4D LORDOT (Spacer) ×2 IMPLANT
SPONGE LAP 4X18 X RAY DECT (DISPOSABLE) IMPLANT
SPONGE NEURO XRAY DETECT 1X3 (DISPOSABLE) IMPLANT
SPONGE SURGIFOAM ABS GEL 100 (HEMOSTASIS) ×2 IMPLANT
STRIP BIOACTIVE VITOSS 25X100X (Neuro Prosthesis/Implant) ×1 IMPLANT
STRIP BIOACTIVE VITOSS 25X52X4 (Orthopedic Implant) ×1 IMPLANT
SUT VIC AB 1 CT1 18XBRD ANBCTR (SUTURE) ×2 IMPLANT
SUT VIC AB 1 CT1 8-18 (SUTURE) ×6
SUT VIC AB 2-0 CP2 18 (SUTURE) ×5 IMPLANT
SYR 3ML LL SCALE MARK (SYRINGE) IMPLANT
SYR CONTROL 10ML LL (SYRINGE) ×2 IMPLANT
TAPE CLOTH SURG 4X10 WHT LF (GAUZE/BANDAGES/DRESSINGS) ×1 IMPLANT
TOWEL GREEN STERILE (TOWEL DISPOSABLE) ×2 IMPLANT
TOWEL GREEN STERILE FF (TOWEL DISPOSABLE) ×2 IMPLANT
TRAY FOLEY MTR SLVR 16FR STAT (SET/KITS/TRAYS/PACK) ×2 IMPLANT
WATER STERILE IRR 1000ML POUR (IV SOLUTION) ×2 IMPLANT

## 2017-07-11 NOTE — Anesthesia Postprocedure Evaluation (Signed)
Anesthesia Post Note  Patient: Glen Jacobson  Procedure(s) Performed: BILATERAL LUMBAR FOUR- LUMBAR FIVE DECOMPRESSION, POSTERIOR LUMBAR INTERBODY FUSION, POSTERIOR LATERAL ARTHRODESIS (Bilateral Back)     Patient location during evaluation: PACU Anesthesia Type: General Level of consciousness: sedated Pain management: pain level controlled Vital Signs Assessment: post-procedure vital signs reviewed and stable Respiratory status: spontaneous breathing and respiratory function stable Cardiovascular status: stable Postop Assessment: no apparent nausea or vomiting Anesthetic complications: no    Last Vitals:  Vitals:   07/11/17 1430 07/11/17 1435  BP: 93/65   Pulse: 63 60  Resp: 18 17  Temp:    SpO2: 94% 94%    Last Pain:  Vitals:   07/11/17 1415  PainSc: 0-No pain                 Doyt Castellana DANIEL

## 2017-07-11 NOTE — Transfer of Care (Signed)
Immediate Anesthesia Transfer of Care Note  Patient: Glen Jacobson  Procedure(s) Performed: BILATERAL LUMBAR FOUR- LUMBAR FIVE DECOMPRESSION, POSTERIOR LUMBAR INTERBODY FUSION, POSTERIOR LATERAL ARTHRODESIS (Bilateral Back)  Patient Location: PACU  Anesthesia Type:General  Level of Consciousness: drowsy  Airway & Oxygen Therapy: Patient Spontanous Breathing and Patient connected to face mask oxygen, OPA  Post-op Assessment: Report given to RN and Post -op Vital signs reviewed and stable  Post vital signs: Reviewed and stable  Last Vitals:  Vitals Value Taken Time  BP 100/65 07/11/2017  1:15 PM  Temp 36.4 C 07/11/2017  1:12 PM  Pulse 73 07/11/2017  1:17 PM  Resp 14 07/11/2017  1:17 PM  SpO2 92 % 07/11/2017  1:17 PM  Vitals shown include unvalidated device data.  Last Pain:  Vitals:   07/11/17 0648  PainSc: 8       Patients Stated Pain Goal: 3 (07/11/17 1610)  Complications: No apparent anesthesia complications

## 2017-07-11 NOTE — Op Note (Signed)
07/11/2017  12:58 PM  PATIENT:  Glen Jacobson  42 y.o. 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, lumbar radiculopathy, lumbar spondylosis, lumbar degenerative disc disease  PROCEDURE:  Procedure(s): Bilateral L4-5 lumbar decompression with bilateral laminectomy, facetectomy, and foraminotomies, for decompression of the stenotic compression of the thecal sac, lateral recesses, and neuroforamina with decompression of the exiting L4 and L5 nerve roots bilaterally, with decompression beyond that required for interbody arthrodesis; bilateral L4-5 posterior lumbar interbody arthrodesis with AVS peek interbody implants, Vitoss BA with bone marrow aspirate, and infuse; bilateral L4-5 posterior lateral arthrodesis with nonsegmental radius posterior intra-mentation, Vitoss BA with bone marrow aspirate, and infuse  SURGEON: Shirlean Kelly, MD  ASSISTANTS: Tressie Stalker, MD  ANESTHESIA:   general  EBL:  Total I/O In: 1300 [I.V.:1300] Out: 610 [Urine:410; Blood:200]  BLOOD ADMINISTERED: None  CELL SAVER GIVEN: Cell Saver technician felt that there was insufficient blood loss to return blood to the patient's.  DICTATION: Patient is brought to the operating room placed under general endotracheal anesthesia. The patient was turned to prone position the lumbar region was prepped with Betadine soap and solution and draped in a sterile fashion.  The previous midline incision was marked.  The midline was infiltrated with local anesthesia with epinephrine. A midline incision was made through the previous midline incision and carried down through the subcutaneous tissue.  Bipolar cautery and electrocautery were used to maintain hemostasis. Dissection was carried down to the lumbar fascia. The fascia was incised bilaterally and the paraspinal  muscles were dissected with a spinous process and lamina in a subperiosteal fashion.  Scar tissue in the previous left L4-5 laminotomy was carefully dissected from the margins of the lamina.  An x-ray was taken for localization and the L4-5 level was localized. Dissection was then carried out laterally over the facet complex and the transverse processes of L4 and L5 were exposed and decorticated.  We then proceeded with the decompression.  The margins of the previous laminotomy and left side were defined, and the laminotomy extended on the left side.  A laminotomy was also performed the right side.  We then proceeded with bilateral facetectomies and foraminotomies with decompression of the stenotic compression of the L4 and L5 nerve roots.  Ligament flavum on the right side was carefully removed, exposing the thecal sac and exiting nerve roots.  On the left side there was extensive epidural scarring that was carefully dissected from the thecal sac and removed.  We then proceeded with the intradiscal discectomy.  The annulus was incised bilaterally and the disc space entered. A thorough discectomy was performed using pituitary rongeurs and curettes.  Good decompression of the thecal sac and exiting nerve roots was achieved.  Once the discectomy was completed we prepared the endplate surfaces removing the cartilaginous endplates surface. We then measured the height of the intervertebral disc space. We selected 11 x 25 x 4 AVS peek interbody implants.  We then proceeded with the interbody arthrodesis.  The C-arm fluoroscope was then draped and brought in the field and we identified the pedicle entry points bilaterally at the L4 and L5 levels. Each of the 4 pedicles was probed, we aspirated bone marrow aspirate from the vertebral bodies, this was injected over a 10 cc and a 5 cc strip of Vitoss BA. Then each of the pedicles was examined with the ball probe good bony  surfaces were found and no bony cuts were found.  Each of the pedicles was then tapped with a 5.25 mm tap, again examined with the ball probe good threading was found and no bony cuts were found. We then placed 5.75 by 45 millimeter screws bilaterally at each level.  We then packed the AVS peek interbody implants with Vitoss BA with bone marrow aspirate and infuse, and then placed the first implant and on the right side, carefully retracting the thecal sac and nerve root medially. We then went back to the left side and packed the midline with additional Vitoss BA with bone marrow aspirate and infuse, and then placed a second implant and on the left side again retracting the thecal sac and nerve root medially. Additional Vitoss BA with bone marrow aspirate and infuse was packed lateral to the implants.  We then packed the lateral gutter over the transverse processes and intertransverse space with Vitoss BA with bone marrow aspirate and infuse. We then selected 35 mm pre-lordosed rods, they were placed within the screw heads and secured with locking caps once all 4 locking caps were placed final tightening was performed against a counter torque.  The wound had been irrigated multiple times during the procedure with saline solution and bacitracin solution, good hemostasis was established with a combination of bipolar cautery and Gelfoam with thrombin. Once good hemostasis was confirmed we proceeded with closure paraspinal muscles deep fascia and Scarpa's fascia were closed with interrupted undyed 1 Vicryl sutures the subcutaneous and subcuticular closed with interrupted inverted 2-0 undyed Vicryl sutures the skin edges were approximated with Dermabond.  A dressing of sterile gauze and Hypafix was applied.  Following surgery the patient was turned back to the supine position to be reversed and the anesthetic extubated and transferred to the recovery room for further care.   PLAN OF CARE: Admit to inpatient   PATIENT DISPOSITION:  PACU - hemodynamically  stable.   Delay start of Pharmacological VTE agent (>24hrs) due to surgical blood loss or risk of bleeding:  yes

## 2017-07-11 NOTE — Progress Notes (Signed)
Called pharmacy for antibiotic to be sent up for surgery

## 2017-07-11 NOTE — Progress Notes (Signed)
Vitals:   07/11/17 1435 07/11/17 1439 07/11/17 1455 07/11/17 1803  BP:   115/86 110/62  Pulse: 60 62 67 60  Resp: Temp:  97.6 F (36.4 C) 97.7 F (36.5 C) (!) 97.5 F (36.4 C)  TempSrc:   Oral Oral  SpO2: 94% 96% 94% 92%  Weight:      Height:         Patient getting up to walk in the halls.  Has already ambulated twice.  Dressing clean and dry.  Foley DC'd 75 minutes ago, no void yet, nursing staff monitoring voiding function.  Good relief of left lumbar radicular pain.  Moderate incisional pain.  Plan: Doing well following surgery.  Encouraged to ambulate.  Continue to progress through postoperative recovery.  Hewitt Shorts, MD 07/11/2017, 6:31 PM
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
## 2017-07-12 LAB — GLUCOSE, CAPILLARY: Glucose-Capillary: 166 mg/dL — ABNORMAL HIGH (ref 65–99)

## 2017-07-12 MED ORDER — CYCLOBENZAPRINE HCL 10 MG PO TABS: 10.0000 mg | ORAL_TABLET | Freq: Three times a day (TID) | ORAL | Status: DC | PRN

## 2017-07-12 MED ORDER — CYCLOBENZAPRINE HCL 10 MG PO TABS: 5.0000 mg | ORAL_TABLET | Freq: Three times a day (TID) | ORAL | 0 refills | Status: DC | PRN

## 2017-07-12 MED ORDER — HYDROCODONE-ACETAMINOPHEN 5-325 MG PO TABS: 1.0000 | ORAL_TABLET | ORAL | 0 refills | Status: DC | PRN

## 2017-07-12 MED FILL — Thrombin For Soln 20000 Unit: CUTANEOUS | Qty: 1 | Status: AC

## 2017-07-12 MED FILL — Thrombin For Soln 5000 Unit: CUTANEOUS | Qty: 5000 | Status: AC

## 2017-07-12 NOTE — Discharge Summary (Signed)
Physician Discharge Summary  Patient ID: Glen Jacobson MRN: [id number] DOB/AGE: 07/19/1975 42 y.o.  Admit date: 07/11/2017 Discharge date: 07/12/2017  Admission Diagnoses:  Recurrent L4-5 lumbar disc herniation, lumbar radiculopathy, lumbar spondylosis, lumbar degenerative disc disease  Discharge Diagnoses:  Recurrent L4-5 lumbar disc herniation, lumbar radiculopathy, lumbar spondylosis, lumbar degenerative disc disease  Active Problems:   HNP (herniated nucleus pulposus), lumbar   Discharged Condition: good  Hospital Course: Patient was admitted, underwent an L4-5 lumbar decompression and arthrodesis.  He is done well following surgery.  He is up and ambulating actively.  His dressing was removed prior to discharge, and his incision is healing nicely.  There is some mild ecchymosis, but no swelling or drainage.  He is voiding well.  He is being discharged home with instructions regarding wound care and activities.  He is scheduled for follow-up with me in 3 to 4 weeks with x-rays at the office.  Discharge Exam: Blood pressure 99/63, pulse (!) 46, temperature 97.9 F (36.6 C), temperature source Oral, resp. rate 18, height  (1.803 m), weight 109.3 kg (241 lb), SpO2 95 %.  Disposition: Discharge disposition: 01-Home or Self Care       Discharge Instructions    Discharge wound care:   Complete by:  As directed    Leave the wound open to air. Shower daily with the wound uncovered. Water and soapy water should run over the incision area. Do not wash directly on the incision for 2 weeks. Remove the glue after 2 weeks.   Driving Restrictions   Complete by:  As directed    No driving for 2 weeks. May ride in the car locally now. May begin to drive locally in 2 weeks.   Other Restrictions   Complete by:  As directed    Walk gradually increasing distances out in the fresh air at least twice a day. Walking additional 6 times inside the house, gradually increasing distances, daily. No  bending, lifting, or twisting. Perform activities between shoulder and waist height (that is at counter height when standing or table height when sitting).     Allergies as of 07/12/2017      Reactions   Ciprofloxacin Other (See Comments)   UNSPECIFIED REACTION  "NO IV, makes me deathly sick" > [can take orally]   Penicillins Other (See Comments)   UNSPECIFIED REACTION OF CHILDHOOD Has patient had a PCN reaction causing immediate rash, facial/tongue/throat swelling, SOB or lightheadedness with hypotension: Unknown Has patient had a PCN reaction causing severe rash involving mucus membranes or skin necrosis: Unknown Has patient had a PCN reaction that required hospitalization: Unknown Has patient had a PCN reaction occurring within the last 10 years: No If all of the above answers are "NO", then may proceed with Cephalosporin use.   Morphine And Related Other (See Comments)   Hallucinations      Medication List    TAKE these medications   ALPRAZolam 1 MG tablet Commonly known as:  XANAX Take 1 tablet (1 mg total) by mouth 4 (four) times daily.   aspirin-acetaminophen-caffeine 250-250-65 MG tablet Commonly known as:  EXCEDRIN MIGRAINE Take 3 tablets by mouth 3 (three) times daily as needed for headache.   atorvastatin 40 MG tablet Commonly known as:  LIPITOR Take 1 tablet (40 mg total) by mouth at bedtime.   cyclobenzaprine 10 MG tablet Commonly known as:  FLEXERIL Take 0.5-1 tablets (5-10 mg total) by mouth 3 (three) times daily as needed for muscle spasms.  DULoxetine 60 MG capsule Commonly known as:  CYMBALTA Take 1 capsule (60 mg total) by mouth 2 (two) times daily.   EPIPEN 2-PAK 0.3 mg/0.3 mL Soaj injection Generic drug:  EPINEPHrine Inject 0.3 mg into the muscle daily as needed (FOR ALLERGIC REACTION.).   gabapentin 600 MG tablet Commonly known as:  NEURONTIN Take 600 mg by mouth 2 (two) times daily.   HYDROcodone-acetaminophen 5-325 MG tablet Commonly known as:   NORCO/VICODIN Take 1-2 tablets by mouth every 4 (four) hours as needed (pain).   levothyroxine 88 MCG tablet Commonly known as:  SYNTHROID, LEVOTHROID TAKE ONE TABLET BY MOUTH EVERY DAY BEFORE BREAKFAST What changed:    how much to take  how to take this  when to take this  additional instructions   Lurasidone HCl 120 MG Tabs Commonly known as:  LATUDA TAKE ONE TABLET BY MOUTH DAILY AFTER SUPPER   metFORMIN 500 MG tablet Commonly known as:  GLUCOPHAGE TAKE ONE TABLET BY MOUTH EVERY DAY WITH BREAKFAST What changed:    how much to take  how to take this  when to take this  additional instructions   Vitamin D3 5000 units Caps TAKE 1 CAPSULE BY MOUTH DAILY What changed:    how much to take  how to take this  when to take this            Discharge Care Instructions  (From admission, onward)        Start     Ordered   07/12/17 0000  Discharge wound care:    Comments:  Leave the wound open to air. Shower daily with the wound uncovered. Water and soapy water should run over the incision area. Do not wash directly on the incision for 2 weeks. Remove the glue after 2 weeks.   07/12/17 0954       Signed: Hewitt Shorts 07/12/2017, 9:54 AM

## 2017-07-12 NOTE — Progress Notes (Signed)
Pt doing well. Pt and wife given D/C instructions with Rx's, verbal understanding was provided. Pt's incision is open to air and has no sign of infection. Pt's IV was removed prior to D/C. Pt D/C'd home via walking per MD order. Pt is stable @ D/C and has no other needs at this time. Rema Fendt, RN

## 2017-07-12 NOTE — Discharge Instructions (Signed)

## 2017-07-13 MED FILL — Sodium Chloride IV Soln 0.9%: INTRAVENOUS | Qty: 1000 | Status: AC

## 2017-07-13 MED FILL — Heparin Sodium (Porcine) Inj 1000 Unit/ML: INTRAMUSCULAR | Qty: 30 | Status: AC

## 2017-07-22 ENCOUNTER — Other Ambulatory Visit: Payer: Self-pay | Admitting: "Endocrinology

## 2017-07-31 ENCOUNTER — Other Ambulatory Visit: Payer: Self-pay | Admitting: "Endocrinology

## 2017-08-01 LAB — LIPID PANEL W/O CHOL/HDL RATIO
Cholesterol, Total: 178 mg/dL (ref 100–199)
HDL: 31 mg/dL — AB (ref >39)
LDL Calculated: 92 mg/dL (ref 0–99)
TRIGLYCERIDES: 277 mg/dL — AB (ref 0–149)
VLDL CHOLESTEROL CAL: 55 mg/dL — AB (ref 5–40)

## 2017-08-01 LAB — T4, FREE: FREE T4: 1.29 ng/dL (ref 0.82–1.77)

## 2017-08-01 LAB — TSH: TSH: 1.95 u[IU]/mL (ref 0.450–4.500)

## 2017-08-01 LAB — HGB A1C W/O EAG: Hgb A1c MFr Bld: 5.7 % — ABNORMAL HIGH (ref 4.8–5.6)

## 2017-08-08 ENCOUNTER — Encounter: Payer: Self-pay | Admitting: "Endocrinology

## 2017-08-08 ENCOUNTER — Ambulatory Visit (INDEPENDENT_AMBULATORY_CARE_PROVIDER_SITE_OTHER): Payer: 59 | Admitting: "Endocrinology

## 2017-08-08 VITALS — BP 127/87 | HR 83 | Wt 254.0 lb

## 2017-08-08 DIAGNOSIS — E782 Mixed hyperlipidemia: Secondary | ICD-10-CM

## 2017-08-08 DIAGNOSIS — R7303 Prediabetes: Secondary | ICD-10-CM

## 2017-08-08 DIAGNOSIS — E559 Vitamin D deficiency, unspecified: Secondary | ICD-10-CM

## 2017-08-08 DIAGNOSIS — E038 Other specified hypothyroidism: Secondary | ICD-10-CM

## 2017-08-08 MED ORDER — METFORMIN HCL 500 MG PO TABS: 500.0000 mg | ORAL_TABLET | Freq: Every day | ORAL | 1 refills | Status: DC

## 2017-08-08 MED ORDER — ATORVASTATIN CALCIUM 40 MG PO TABS: 40.0000 mg | ORAL_TABLET | Freq: Every day | ORAL | 1 refills | Status: DC

## 2017-08-08 MED ORDER — LEVOTHYROXINE SODIUM 88 MCG PO TABS: ORAL_TABLET | ORAL | 1 refills | Status: DC

## 2017-08-08 NOTE — Patient Instructions (Signed)
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
## 2017-09-03 ENCOUNTER — Ambulatory Visit (HOSPITAL_COMMUNITY): Payer: 59 | Admitting: Psychiatry

## 2017-09-20 ENCOUNTER — Ambulatory Visit (INDEPENDENT_AMBULATORY_CARE_PROVIDER_SITE_OTHER): Payer: 59 | Admitting: Psychiatry

## 2017-09-20 ENCOUNTER — Encounter (HOSPITAL_COMMUNITY): Payer: Self-pay | Admitting: Psychiatry

## 2017-09-20 VITALS — BP 128/93 | HR 88 | Ht 71.0 in | Wt 260.0 lb

## 2017-09-20 DIAGNOSIS — Z818 Family history of other mental and behavioral disorders: Secondary | ICD-10-CM | POA: Diagnosis not present

## 2017-09-20 DIAGNOSIS — M549 Dorsalgia, unspecified: Secondary | ICD-10-CM

## 2017-09-20 DIAGNOSIS — F902 Attention-deficit hyperactivity disorder, combined type: Secondary | ICD-10-CM

## 2017-09-20 DIAGNOSIS — Z811 Family history of alcohol abuse and dependence: Secondary | ICD-10-CM

## 2017-09-20 DIAGNOSIS — F17210 Nicotine dependence, cigarettes, uncomplicated: Secondary | ICD-10-CM

## 2017-09-20 DIAGNOSIS — R450 Nervousness: Secondary | ICD-10-CM | POA: Diagnosis not present

## 2017-09-20 DIAGNOSIS — F3162 Bipolar disorder, current episode mixed, moderate: Secondary | ICD-10-CM

## 2017-09-20 MED ORDER — DULOXETINE HCL 60 MG PO CPEP: 60.0000 mg | ORAL_CAPSULE | Freq: Two times a day (BID) | ORAL | 2 refills | Status: DC

## 2017-09-20 MED ORDER — LURASIDONE HCL 120 MG PO TABS: ORAL_TABLET | ORAL | 2 refills | Status: DC

## 2017-09-20 MED ORDER — ALPRAZOLAM 1 MG PO TABS: 1.0000 mg | ORAL_TABLET | Freq: Four times a day (QID) | ORAL | 2 refills | Status: DC
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
## 2017-11-21 ENCOUNTER — Encounter (HOSPITAL_COMMUNITY): Payer: Self-pay | Admitting: Psychiatry

## 2017-11-21 ENCOUNTER — Ambulatory Visit (INDEPENDENT_AMBULATORY_CARE_PROVIDER_SITE_OTHER): Payer: 59 | Admitting: Psychiatry

## 2017-11-21 VITALS — BP 120/86 | HR 100 | Ht 71.0 in | Wt 265.0 lb

## 2017-11-21 DIAGNOSIS — F3162 Bipolar disorder, current episode mixed, moderate: Secondary | ICD-10-CM | POA: Diagnosis not present

## 2017-11-21 DIAGNOSIS — F17210 Nicotine dependence, cigarettes, uncomplicated: Secondary | ICD-10-CM

## 2017-11-21 DIAGNOSIS — F902 Attention-deficit hyperactivity disorder, combined type: Secondary | ICD-10-CM

## 2017-11-21 MED ORDER — LURASIDONE HCL 120 MG PO TABS: ORAL_TABLET | ORAL | 2 refills | Status: DC

## 2017-11-21 MED ORDER — DULOXETINE HCL 60 MG PO CPEP: 60.0000 mg | ORAL_CAPSULE | Freq: Two times a day (BID) | ORAL | 2 refills | Status: DC

## 2017-11-21 MED ORDER — ALPRAZOLAM 1 MG PO TABS: 1.0000 mg | ORAL_TABLET | Freq: Four times a day (QID) | ORAL | 2 refills | Status: DC

## 2017-11-21 MED ORDER — LISDEXAMFETAMINE DIMESYLATE 40 MG PO CAPS: 40.0000 mg | ORAL_CAPSULE | ORAL | 0 refills | Status: DC
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
## 2017-12-21 ENCOUNTER — Encounter (HOSPITAL_COMMUNITY): Payer: Self-pay | Admitting: Psychiatry

## 2017-12-21 ENCOUNTER — Ambulatory Visit (INDEPENDENT_AMBULATORY_CARE_PROVIDER_SITE_OTHER): Payer: 59 | Admitting: Psychiatry

## 2017-12-21 VITALS — BP 119/87 | HR 88 | Ht 71.0 in | Wt 258.0 lb

## 2017-12-21 DIAGNOSIS — F3162 Bipolar disorder, current episode mixed, moderate: Secondary | ICD-10-CM

## 2017-12-21 DIAGNOSIS — F902 Attention-deficit hyperactivity disorder, combined type: Secondary | ICD-10-CM | POA: Diagnosis not present

## 2017-12-21 MED ORDER — ALPRAZOLAM 1 MG PO TABS: 1.0000 mg | ORAL_TABLET | Freq: Four times a day (QID) | ORAL | 2 refills | Status: DC

## 2017-12-21 MED ORDER — AMPHETAMINE-DEXTROAMPHET ER 30 MG PO CP24
30.0000 mg | ORAL_CAPSULE | ORAL | 0 refills | Status: DC
Start: 2017-12-21 — End: 2018-01-11

## 2017-12-21 MED ORDER — LURASIDONE HCL 120 MG PO TABS: ORAL_TABLET | ORAL | 2 refills | Status: DC

## 2017-12-21 MED ORDER — AMPHETAMINE-DEXTROAMPHET ER 30 MG PO CP24
30.0000 mg | ORAL_CAPSULE | ORAL | 0 refills | Status: DC
Start: 2017-12-21 — End: 2018-01-17

## 2017-12-21 MED ORDER — DULOXETINE HCL 60 MG PO CPEP: 60.0000 mg | ORAL_CAPSULE | Freq: Two times a day (BID) | ORAL | 2 refills | Status: DC
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
## 2018-01-11 ENCOUNTER — Other Ambulatory Visit: Payer: Self-pay

## 2018-01-11 ENCOUNTER — Ambulatory Visit (INDEPENDENT_AMBULATORY_CARE_PROVIDER_SITE_OTHER): Payer: 59 | Admitting: Psychiatry

## 2018-01-11 ENCOUNTER — Encounter (HOSPITAL_COMMUNITY): Payer: Self-pay | Admitting: Psychiatry

## 2018-01-11 ENCOUNTER — Encounter (HOSPITAL_COMMUNITY): Payer: Self-pay | Admitting: Emergency Medicine

## 2018-01-11 ENCOUNTER — Encounter (HOSPITAL_COMMUNITY): Payer: Self-pay

## 2018-01-11 ENCOUNTER — Emergency Department (HOSPITAL_COMMUNITY)
Admission: EM | Admit: 2018-01-11 | Discharge: 2018-01-11 | Disposition: A | Discharge status: Other Acute Inpatient Hospital | Payer: 59 | Source: Home / Self Care | Attending: Emergency Medicine | Admitting: Emergency Medicine

## 2018-01-11 ENCOUNTER — Inpatient Hospital Stay (HOSPITAL_COMMUNITY)
Admission: AD | Admit: 2018-01-11 | Discharge: 2018-01-16 | DRG: 885 | Disposition: A | Discharge status: Home / Self Care | Payer: 59 | Source: Intra-hospital | Attending: Psychiatry | Admitting: Psychiatry

## 2018-01-11 VITALS — BP 114/81 | HR 105 | Ht 71.0 in | Wt 254.0 lb

## 2018-01-11 DIAGNOSIS — I10 Essential (primary) hypertension: Secondary | ICD-10-CM | POA: Insufficient documentation

## 2018-01-11 DIAGNOSIS — E119 Type 2 diabetes mellitus without complications: Secondary | ICD-10-CM | POA: Diagnosis present

## 2018-01-11 DIAGNOSIS — Z79899 Other long term (current) drug therapy: Secondary | ICD-10-CM | POA: Insufficient documentation

## 2018-01-11 DIAGNOSIS — Z79890 Hormone replacement therapy (postmenopausal): Secondary | ICD-10-CM

## 2018-01-11 DIAGNOSIS — Z881 Allergy status to other antibiotic agents status: Secondary | ICD-10-CM

## 2018-01-11 DIAGNOSIS — E782 Mixed hyperlipidemia: Secondary | ICD-10-CM | POA: Diagnosis present

## 2018-01-11 DIAGNOSIS — F329 Major depressive disorder, single episode, unspecified: Secondary | ICD-10-CM | POA: Insufficient documentation

## 2018-01-11 DIAGNOSIS — Z716 Tobacco abuse counseling: Secondary | ICD-10-CM | POA: Diagnosis not present

## 2018-01-11 DIAGNOSIS — Z885 Allergy status to narcotic agent status: Secondary | ICD-10-CM | POA: Diagnosis not present

## 2018-01-11 DIAGNOSIS — G8929 Other chronic pain: Secondary | ICD-10-CM | POA: Diagnosis present

## 2018-01-11 DIAGNOSIS — F3162 Bipolar disorder, current episode mixed, moderate: Secondary | ICD-10-CM

## 2018-01-11 DIAGNOSIS — Z23 Encounter for immunization: Secondary | ICD-10-CM | POA: Diagnosis not present

## 2018-01-11 DIAGNOSIS — R45851 Suicidal ideations: Secondary | ICD-10-CM | POA: Diagnosis present

## 2018-01-11 DIAGNOSIS — Z7984 Long term (current) use of oral hypoglycemic drugs: Secondary | ICD-10-CM | POA: Insufficient documentation

## 2018-01-11 DIAGNOSIS — F3130 Bipolar disorder, current episode depressed, mild or moderate severity, unspecified: Secondary | ICD-10-CM | POA: Insufficient documentation

## 2018-01-11 DIAGNOSIS — E038 Other specified hypothyroidism: Secondary | ICD-10-CM | POA: Diagnosis present

## 2018-01-11 DIAGNOSIS — F17210 Nicotine dependence, cigarettes, uncomplicated: Secondary | ICD-10-CM

## 2018-01-11 DIAGNOSIS — F319 Bipolar disorder, unspecified: Secondary | ICD-10-CM | POA: Diagnosis present

## 2018-01-11 DIAGNOSIS — F411 Generalized anxiety disorder: Secondary | ICD-10-CM | POA: Diagnosis present

## 2018-01-11 DIAGNOSIS — Z880 Allergy status to penicillin: Secondary | ICD-10-CM

## 2018-01-11 DIAGNOSIS — E039 Hypothyroidism, unspecified: Secondary | ICD-10-CM

## 2018-01-11 DIAGNOSIS — F32A Depression, unspecified: Secondary | ICD-10-CM

## 2018-01-11 DIAGNOSIS — G4700 Insomnia, unspecified: Secondary | ICD-10-CM | POA: Diagnosis present

## 2018-01-11 DIAGNOSIS — Z818 Family history of other mental and behavioral disorders: Secondary | ICD-10-CM | POA: Diagnosis not present

## 2018-01-11 DIAGNOSIS — F902 Attention-deficit hyperactivity disorder, combined type: Secondary | ICD-10-CM | POA: Diagnosis present

## 2018-01-11 LAB — COMPREHENSIVE METABOLIC PANEL
ALT: 35 U/L (ref 0–44)
AST: 32 U/L (ref 15–41)
Albumin: 4.6 g/dL (ref 3.5–5.0)
Alkaline Phosphatase: 115 U/L (ref 38–126)
Anion gap: 10 (ref 5–15)
BUN: 7 mg/dL (ref 6–20)
CHLORIDE: 100 mmol/L (ref 98–111)
CO2: 26 mmol/L (ref 22–32)
CREATININE: 1.14 mg/dL (ref 0.61–1.24)
Calcium: 9.3 mg/dL (ref 8.9–10.3)
Glucose, Bld: 93 mg/dL (ref 70–99)
Potassium: 3.7 mmol/L (ref 3.5–5.1)
Sodium: 136 mmol/L (ref 135–145)
Total Bilirubin: 0.6 mg/dL (ref 0.3–1.2)
Total Protein: 8.2 g/dL — ABNORMAL HIGH (ref 6.5–8.1)

## 2018-01-11 LAB — CBC
HEMATOCRIT: 53.9 % — AB (ref 39.0–52.0)
Hemoglobin: 17 g/dL (ref 13.0–17.0)
MCH: 28.3 pg (ref 26.0–34.0)
MCHC: 31.5 g/dL (ref 30.0–36.0)
MCV: 89.8 fL (ref 80.0–100.0)
NRBC: 0 % (ref 0.0–0.2)
Platelets: 429 10*3/uL — ABNORMAL HIGH (ref 150–400)
RBC: 6 MIL/uL — ABNORMAL HIGH (ref 4.22–5.81)
RDW: 13.8 % (ref 11.5–15.5)
WBC: 15 10*3/uL — ABNORMAL HIGH (ref 4.0–10.5)

## 2018-01-11 LAB — ETHANOL

## 2018-01-11 LAB — RAPID URINE DRUG SCREEN, HOSP PERFORMED
Amphetamines: POSITIVE — AB
BARBITURATES: NOT DETECTED
BENZODIAZEPINES: POSITIVE — AB
COCAINE: NOT DETECTED
Opiates: NOT DETECTED
TETRAHYDROCANNABINOL: NOT DETECTED

## 2018-01-11 LAB — ACETAMINOPHEN LEVEL: Acetaminophen (Tylenol), Serum: <10 ug/mL — ABNORMAL LOW (ref 10–30)

## 2018-01-11 LAB — SALICYLATE LEVEL: Salicylate Lvl: <7 mg/dL (ref 2.8–30.0)

## 2018-01-11 MED ORDER — LEVOTHYROXINE SODIUM 88 MCG PO TABS
88.0000 ug | ORAL_TABLET | Freq: Every day | ORAL | Status: DC
  Filled 2018-01-11 (×3): qty 1

## 2018-01-11 MED ORDER — HYDROXYZINE HCL 25 MG PO TABS
25.0000 mg | ORAL_TABLET | Freq: Three times a day (TID) | ORAL | Status: DC | PRN
  Administered 2018-01-11: 25 mg via ORAL

## 2018-01-11 MED ORDER — MAGNESIUM HYDROXIDE 400 MG/5ML PO SUSP: 30.0000 mL | Freq: Every day | ORAL | Status: DC | PRN

## 2018-01-11 MED ORDER — TRAZODONE HCL 50 MG PO TABS
50.0000 mg | ORAL_TABLET | Freq: Every evening | ORAL | Status: DC | PRN
  Administered 2018-01-11: 50 mg via ORAL

## 2018-01-11 MED ORDER — ATORVASTATIN CALCIUM 40 MG PO TABS: 40.0000 mg | ORAL_TABLET | Freq: Every day | ORAL | Status: DC

## 2018-01-11 MED ORDER — ALUM & MAG HYDROXIDE-SIMETH 200-200-20 MG/5ML PO SUSP: 30.0000 mL | ORAL | Status: DC | PRN

## 2018-01-11 MED ORDER — LURASIDONE HCL 40 MG PO TABS
120.0000 mg | ORAL_TABLET | Freq: Every day | ORAL | Status: DC
  Filled 2018-01-11 (×3): qty 3

## 2018-01-11 MED ORDER — GABAPENTIN 300 MG PO CAPS
ORAL_CAPSULE | ORAL | Status: AC
  Administered 2018-01-11: 13:00:00
  Filled 2018-01-11: qty 2

## 2018-01-11 MED ORDER — NICOTINE 21 MG/24HR TD PT24
21.0000 mg | MEDICATED_PATCH | Freq: Every day | TRANSDERMAL | Status: DC
  Administered 2018-01-12 – 2018-01-16 (×5): 21 mg via TRANSDERMAL
  Filled 2018-01-11 (×7): qty 1

## 2018-01-11 MED ORDER — PNEUMOCOCCAL VAC POLYVALENT 25 MCG/0.5ML IJ INJ
0.5000 mL | INJECTION | INTRAMUSCULAR | Status: AC
  Administered 2018-01-14: 0.5 mL via INTRAMUSCULAR

## 2018-01-11 MED ORDER — NICOTINE 21 MG/24HR TD PT24
21.0000 mg | MEDICATED_PATCH | Freq: Once | TRANSDERMAL | Status: DC
  Administered 2018-01-11: 21 mg via TRANSDERMAL
  Filled 2018-01-11: qty 1

## 2018-01-11 MED ORDER — GABAPENTIN 600 MG PO TABS
600.0000 mg | ORAL_TABLET | Freq: Two times a day (BID) | ORAL | Status: DC
  Administered 2018-01-11: 600 mg via ORAL

## 2018-01-11 MED ORDER — DULOXETINE HCL 30 MG PO CPEP: 60.0000 mg | ORAL_CAPSULE | Freq: Two times a day (BID) | ORAL | Status: DC

## 2018-01-11 MED ORDER — AMPHETAMINE-DEXTROAMPHET ER 30 MG PO CP24: 30.0000 mg | ORAL_CAPSULE | ORAL | Status: DC

## 2018-01-11 MED ORDER — METFORMIN HCL 500 MG PO TABS: 500.0000 mg | ORAL_TABLET | Freq: Every day | ORAL | Status: DC

## 2018-01-11 MED ORDER — ACETAMINOPHEN 325 MG PO TABS: 650.0000 mg | ORAL_TABLET | Freq: Four times a day (QID) | ORAL | Status: DC | PRN

## 2018-01-11 MED ORDER — GABAPENTIN 300 MG PO CAPS: 600.0000 mg | ORAL_CAPSULE | Freq: Two times a day (BID) | ORAL | Status: DC

## 2018-01-11 MED ORDER — ALPRAZOLAM 0.5 MG PO TABS
1.0000 mg | ORAL_TABLET | Freq: Four times a day (QID) | ORAL | Status: DC
  Administered 2018-01-11: 1 mg via ORAL
  Filled 2018-01-11: qty 2

## 2018-01-11 MED ORDER — INFLUENZA VAC SPLIT QUAD 0.5 ML IM SUSY
0.5000 mL | PREFILLED_SYRINGE | INTRAMUSCULAR | Status: AC
  Administered 2018-01-14: 0.5 mL via INTRAMUSCULAR
  Filled 2018-01-11: qty 0.5

## 2018-01-11 NOTE — [facility] Group Notes (Signed)
Pt was invited but did not attend wrap up group.  
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
## 2018-01-11 NOTE — ED Notes (Signed)
Call to [facility] to inform pt is enroute

## 2018-01-11 NOTE — ED Notes (Signed)
[facility] called   Report called to Inetta Fermo, RN, [facility]  Call from Sarah, TTS pt will go to rm [id number] Accepting : Money, NP Physician: Tamera Punt

## 2018-01-11 NOTE — ED Triage Notes (Signed)
Patient states "I saw Dr Tenny Craw this morning and she told me to come to the ER for a psychiatric evaluation." States he is having suicidal ideation that started yesterday due to loss of job.
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
## 2018-01-11 NOTE — Progress Notes (Signed)
Pt accepted to [facility]; room 403-1 Reola Calkins, NP is the accepting provider.   Dr. Jola Babinski is the attending provider.  Call report to [phone]   [facility] notified.   Pt is voluntary and can be transported by [facility] Pt is scheduled to arrive as soon as transportation can be arranged.   Wells Guiles, LCSW, LCAS Disposition CSW [facility]/TTS [phone]

## 2018-01-11 NOTE — ED Notes (Signed)
Dr B in to speak with pt

## 2018-01-11 NOTE — ED Notes (Signed)
Pt and spouse informed of pending transfer  Voluntary admit and consent from signed and faxed

## 2018-01-11 NOTE — ED Notes (Signed)
Call from [facility]  Placement and ACs looking to see if pt can come to [facility]   TTS will call back

## 2018-01-11 NOTE — ED Notes (Signed)
Pt given lunch tray.
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
## 2018-01-11 NOTE — ED Notes (Signed)
TTS complete 

## 2018-01-11 NOTE — ED Notes (Signed)
TTS machine to room for eval

## 2018-01-11 NOTE — ED Notes (Signed)
Pt report that the nicotine patch is not working and asks how long it is supposed to last

## 2018-01-11 NOTE — Patient Instructions (Signed)
Please go to [facility] ED for evaluation for admission

## 2018-01-11 NOTE — ED Notes (Signed)
Urine spec to lab 

## 2018-01-11 NOTE — ED Notes (Signed)
TTS continues 

## 2018-01-11 NOTE — ED Notes (Signed)
tts in progress 

## 2018-01-11 NOTE — ED Notes (Signed)
TTS is aware of need to assess  Informed that there are walkins and will be assessed first

## 2018-01-11 NOTE — ED Notes (Signed)
Pt reports previous admissions while an adolescent to [facility] Reports a Dx of Bipolar Was at Dr Tenny Craw this am when reported SI was told to come to ED   Unknown if [facility] has been informed

## 2018-01-12 DIAGNOSIS — F411 Generalized anxiety disorder: Secondary | ICD-10-CM

## 2018-01-12 DIAGNOSIS — F319 Bipolar disorder, unspecified: Secondary | ICD-10-CM

## 2018-01-12 DIAGNOSIS — E039 Hypothyroidism, unspecified: Secondary | ICD-10-CM

## 2018-01-12 DIAGNOSIS — E119 Type 2 diabetes mellitus without complications: Secondary | ICD-10-CM

## 2018-01-12 MED ORDER — LURASIDONE HCL 60 MG PO TABS
120.0000 mg | ORAL_TABLET | Freq: Every day | ORAL | Status: DC
  Administered 2018-01-12 – 2018-01-15 (×4): 120 mg via ORAL
  Filled 2018-01-12 (×6): qty 2

## 2018-01-12 MED ORDER — ATORVASTATIN CALCIUM 40 MG PO TABS
40.0000 mg | ORAL_TABLET | Freq: Every day | ORAL | Status: DC
  Administered 2018-01-12 – 2018-01-15 (×4): 40 mg via ORAL
  Filled 2018-01-12 (×6): qty 1

## 2018-01-12 MED ORDER — DULOXETINE HCL 60 MG PO CPEP
60.0000 mg | ORAL_CAPSULE | Freq: Two times a day (BID) | ORAL | Status: DC
  Administered 2018-01-12 – 2018-01-16 (×9): 60 mg via ORAL
  Filled 2018-01-12 (×12): qty 1

## 2018-01-12 MED ORDER — METFORMIN HCL 500 MG PO TABS
500.0000 mg | ORAL_TABLET | Freq: Every day | ORAL | Status: DC
  Administered 2018-01-12 – 2018-01-15 (×4): 500 mg via ORAL
  Filled 2018-01-12 (×6): qty 1

## 2018-01-12 MED ORDER — ALPRAZOLAM 1 MG PO TABS
ORAL_TABLET | ORAL | Status: AC
  Filled 2018-01-12: qty 1

## 2018-01-12 MED ORDER — ALPRAZOLAM 1 MG PO TABS
1.0000 mg | ORAL_TABLET | Freq: Four times a day (QID) | ORAL | Status: DC
  Administered 2018-01-12 – 2018-01-15 (×15): 1 mg via ORAL
  Filled 2018-01-12 (×14): qty 1

## 2018-01-12 MED ORDER — GABAPENTIN 600 MG PO TABS
600.0000 mg | ORAL_TABLET | Freq: Three times a day (TID) | ORAL | Status: DC
  Administered 2018-01-12 – 2018-01-16 (×12): 600 mg via ORAL
  Filled 2018-01-12 (×20): qty 1

## 2018-01-12 MED ORDER — DULOXETINE HCL 60 MG PO CPEP
ORAL_CAPSULE | ORAL | Status: AC
  Filled 2018-01-12: qty 1

## 2018-01-12 MED ORDER — TOPIRAMATE 25 MG PO TABS
25.0000 mg | ORAL_TABLET | Freq: Every day | ORAL | Status: DC
  Administered 2018-01-12: 25 mg via ORAL
  Filled 2018-01-12 (×3): qty 1

## 2018-01-12 MED ORDER — LEVOTHYROXINE SODIUM 88 MCG PO TABS
88.0000 ug | ORAL_TABLET | Freq: Every day | ORAL | Status: DC
  Administered 2018-01-13 – 2018-01-16 (×4): 88 ug via ORAL
  Filled 2018-01-12 (×7): qty 1

## 2018-01-12 MED ORDER — GABAPENTIN 600 MG PO TABS
ORAL_TABLET | ORAL | Status: AC
  Filled 2018-01-12: qty 1

## 2018-01-12 NOTE — B[facility] Group Notes (Signed)
[facility] Group Notes:  (Nursing/MHT/Case Management/Adjunct)  Date:  01/12/2018  Time:  6:34 PM  Type of Therapy:  Nurse Education  Participation Level:  Active  Participation Quality:  Appropriate and Attentive  Affect:  Appropriate  Cognitive:  Appropriate  Insight:  Appropriate and Good  Engagement in Group:  Engaged  Modes of Intervention:  Discussion and Education  Summary of Progress/Problems: Patient's discussed love languages and healthy communication. How to develop a positive self-image and self-esteem was also covered.  Kirstie Mirza 01/12/2018, 6:34 PM

## 2018-01-12 NOTE — B[facility] Group Notes (Signed)
LCSW Group Therapy Note  01/12/2018    11:00-11:45am   Type of Therapy and Topic:  Group Therapy: Anger and Coping Skills  Participation Level:  Active   Description of Group:   In this group, patients learned how to recognize the physical, cognitive, emotional, and behavioral responses they have to anger-provoking situations.  They identified how they usually or often react when angered, and learned how healthy and unhealthy coping skills work initially, but the unhealthy ones stop working.   They analyzed how their frequently-chosen coping skill is possibly beneficial and how it is possibly unhelpful.  The group discussed a variety of healthier coping skills that could help in resolving the actual issues, as well as how to go about planning for the the possibility of future similar situations.  Therapeutic Goals: 1. Patients will identify one thing that makes them angry and how they feel emotionally and physically, what their thoughts are or tend to be in those situations, and what healthy or unhealthy coping mechanism they typically use 2. Patients will identify how their coping technique works for them, as well as how it works against them. 3. Patients will explore possible new behaviors to use in future anger situations. 4. Patients will learn that anger itself is normal and cannot be eliminated, and that healthier coping skills can assist with resolving conflict rather than worsening situations.  Summary of Patient Progress:  The patient did not arrive in group until the last 10-15 minutes.  He asked the question of "what to do" if you just suddenly get angry for no reason at all.  As the group discussed signals that we may get that we are becoming angry, and discussed paying attention to the underlying emotions, he argued that there are no signals and no underlying emotion.  He said he was very angry about being forced into the hospital "to sign myself in voluntarily" or be placed in the  hospital involuntarily.  Another patient told him she had come under similar circumstances and was very angry at first also, but then came to realize she really needed the help.  He did not respond.  His affect remained flat throughout group and his mood was angry.  Therapeutic Modalities:   Cognitive Behavioral Therapy Motivation Interviewing  Lynnell Chad  .

## 2018-01-12 NOTE — Progress Notes (Signed)
Patient shared with the group that he had a better day since he attended more of his groups and because he spent less time in his bedroom. His goal for tomorrow is to find out who is Child psychotherapist is and begin to address his discharge plans.

## 2018-01-12 NOTE — Progress Notes (Signed)
Nursing Progress Note: 7p-7a D: Pt currently presents with a anxious/irritable affect and behavior. Pt states "I need my xanx. That's all I care about." Interacting appropriately with the milieu. Pt reports good sleep during the previous night with current medication regimen. Pt did not attend wrap-up group.  A: Pt provided with medications per providers orders. Pt's labs and vitals were monitored throughout the night. Pt supported emotionally and encouraged to express concerns and questions. Pt educated on medications.  R: Pt's safety ensured with 15 minute and environmental checks. Pt currently denies SI, HI, and AVH. Pt verbally contracts to seek staff if SI,HI, or AVH occurs and to consult with staff before acting on any harmful thoughts. Will continue to monitor.
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
## 2018-01-12 NOTE — Plan of Care (Signed)
  Problem: Activity: Goal: Interest or engagement in activities will improve Outcome: Progressing   

## 2018-01-12 NOTE — Progress Notes (Signed)
D Pt is observed OOb UAL tolerated fair. HE is guarded. ANxious. Appears angry. Says" I just loook like this" when he is questionsed by this Clinical research associate. He is restarted on xanax and says " I feel better now".    A HE completed his daily assessment and on this he wrote he denied SI today and he rated his depression, hopelessness and anxiety " 9/0/9", respectively. He refused to try to go to morning groups but was present at 1300 when this Clinical research associate did [employer] group. He paid attnetion, he was engaged in the conversation and he is actively trying to identify his unheoalthy behaviors and ways he cna modulate his behavior.    R Safety si in place.

## 2018-01-13 LAB — TSH: TSH: 1.468 u[IU]/mL (ref 0.350–4.500)

## 2018-01-13 MED ORDER — OXCARBAZEPINE 150 MG PO TABS
150.0000 mg | ORAL_TABLET | Freq: Every day | ORAL | Status: DC
  Administered 2018-01-13: 150 mg via ORAL
  Filled 2018-01-13 (×2): qty 1

## 2018-01-13 MED ORDER — OXCARBAZEPINE 150 MG PO TABS
75.0000 mg | ORAL_TABLET | Freq: Two times a day (BID) | ORAL | Status: DC
  Administered 2018-01-13 (×2): 75 mg via ORAL
  Filled 2018-01-13 (×5): qty 0.5

## 2018-01-13 NOTE — Progress Notes (Signed)
Patient states that he had a good day and that he was pleased with the fact that he is on a new medication regimen. He also stated that he had a good visit with his father and stepmother this evening. His goal for tomorrow is to get discharged.

## 2018-01-13 NOTE — Progress Notes (Signed)
Patient ID: Glen Jacobson, male   DOB: 02-10-1976, 42 y.o.   MRN: [id number] D: Patient in room sleeping on on approach. Pt is mostly isolative in his room only coming out for group and medication. Pt reports he is doing well and hoping to discharge tomorrow.  Pt attended evening wrap up group. Denies  SI/HI/AVH and pain.No behavioral issues noted.  A: Support and encouragement offered as needed to express needs. Medications administered as prescribed.  R: Patient is safe and cooperative on unit. Will continue to monitor  for safety and stability.

## 2018-01-13 NOTE — Plan of Care (Signed)
  Problem: Education: Goal: Emotional status will improve Outcome: Progressing   
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
## 2018-01-13 NOTE — Progress Notes (Signed)
D: Patient is alert and cooperative. Patient presents with blank facial expression and flat affect. Patient denies SI, HI, AVH, and verbally contracts for safety. Patient forwards little information during assessment. 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 Patient denies physical symptoms/pain. Patient reports he hasn't spoken to a Child psychotherapist this admission and he would like to.    A: Scheduled medications administered per MD order. Support provided. Patient educated on safety on the unit and medications. Routine safety checks every 15 minutes. Patient stated understanding to tell nurse about any new physical symptoms. Patient understands to tell staff of any needs.     R: No adverse drug reactions noted. Patient verbally contracts for safety. Patient remains safe at this time and will continue to monitor.

## 2018-01-13 NOTE — Progress Notes (Addendum)
D Patient remains OOB UAL on the 400 hall today he tolerates this fairly well. HE continues to endorse a guarded, suspicious affect. He glares at Clinical research associate when she approaches him, no matter for what reason. HE speaks through closed gritted teeth. HE looks angry. HE says " no not at all " in response to this writer asking him if he is upset and / or angry and he says " I always look like this" .     A HE did complete his daily assessment and on this he wrote he denied SI today and he rated his depression, hopelessness and anxiety " 0/0/5", respectively. He smiles ( guardedly at this Clinical research associate) this evening and says " Im getting better". Pos reinforcement is offered.     R Safety is in place.

## 2018-01-13 NOTE — BHH Group Notes (Signed)
BHH LCSW Group Therapy Note  01/13/2018  10:00-11:00AM  Type of Therapy and Topic:  Group Therapy:  Adding Supports Including Being Your Own Support  Participation Level:  Minimal   Description of Group:  Patients in this group were introduced to the concept that additional supports including self-support are an essential part of recovery.  A song entitled "I Need Help!" was played and a group discussion was held in reaction to the idea of needing to add supports.  A song entitled "My Own Hero" was played and a group discussion ensued in which patients stated they could relate to the song and it inspired them to realize they have be willing to help themselves in order to succeed, because other people cannot achieve sobriety or stability for them.  We discussed adding a variety of healthy supports to address the various needs in their lives.  A song was played called "I Know Where I've Been" toward the end of group and used to conduct an inspirational wrap-up to group of remembering how far they have already come in their journey.  Therapeutic Goals: 1)  demonstrate the importance of being a part of one's own support system 2)  discuss reasons people in one's life may eventually be unable to be continually supportive  3)  identify the patient's current support system and   4)  elicit commitments to add healthy supports and to become more conscious of being self-supportive   Summary of Patient Progress:  The patient expressed that his healthy supports include his wife, father, father's wife, and sister, while he has no unhealthy supports.  He did not react to any of the songs played, had a flat affect and did not talk during group.   Therapeutic Modalities:   Motivational Interviewing Activity  Lynnell Chad
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
## 2018-01-13 NOTE — Plan of Care (Signed)
  Problem: Education: Goal: Knowledge of [facility] Education information/materials will improve Outcome: Progressing   Problem: Safety: Goal: Periods of time without injury will increase Outcome: Progressing   Problem: Coping: Goal: Will verbalize feelings Outcome: Progressing  Patient oriented to the unit. Patient verbalized that work was a stressor for him and how it made him feel. Patient remains safe and will continue to monitor.

## 2018-01-14 MED ORDER — OXCARBAZEPINE 300 MG PO TABS
300.0000 mg | ORAL_TABLET | Freq: Every day | ORAL | Status: DC
  Administered 2018-01-14 – 2018-01-15 (×2): 300 mg via ORAL
  Filled 2018-01-14 (×4): qty 1

## 2018-01-14 MED ORDER — OXCARBAZEPINE 150 MG PO TABS
150.0000 mg | ORAL_TABLET | Freq: Every day | ORAL | Status: DC
  Administered 2018-01-14 – 2018-01-16 (×3): 150 mg via ORAL
  Filled 2018-01-14 (×6): qty 1

## 2018-01-14 NOTE — Progress Notes (Signed)
D:  Patient's self inventory sheet, patient has poor sleep, no sleep medication.  Good appetite, normal energy level, good concentration.  Denied depression and hopeless, rated anxiety 4.  Denied withdrawals.  Denied SI.  Denied physical problems.  Denied physical pain.  No pain medicine.  Goal is discharge.  Plans to talk to team to see what they say.  No discharge plans. A:  Medications administered per MD orders.  Emotional support and encouragement given patient. R:  Denied SI and Hi, contracts for safety.  Denied A/V hallucinations.  Safety maintained with 15 minute checks.

## 2018-01-14 NOTE — Progress Notes (Signed)
Recreation Therapy Notes  Date: 11.11.19 Time: 0930 Location: [address] Dayroom  Group Topic: Stress Management  Goal Area(s) Addresses:  Patient will verbalize importance of using healthy stress management.  Patient will identify positive emotions associated with healthy stress management.   Intervention: Stress Management  Activity :  Progressive Muscle Relaxation.  LRT introduced the stress management technique of progressive muscle relaxation.  LRT read a script to lead patients through a series of exercises that allowed them to tense and then relax each muscle group individually.    Education:  Stress Management, Discharge Planning.   Education Outcome: Acknowledges edcuation/In group clarification offered/Needs additional education  Clinical Observations/Feedback: Pt did not attend group.    Cohan Stipes, LRT/CTRS         Glen Jacobson A 01/14/2018 11:06 AM 

## 2018-01-14 NOTE — Progress Notes (Signed)
Adult Psychoeducational Group Note  Date:  01/14/2018 Time:  9:16 PM  Group Topic/Focus:  Wrap-Up Group:   The focus of this group is to help patients review their daily goal of treatment and discuss progress on daily workbooks.  Participation Level:  Active  Participation Quality:  Appropriate  Affect:  Appropriate  Cognitive:  Appropriate  Insight: Appropriate  Engagement in Group:  Engaged  Modes of Intervention:  Discussion  Additional Comments:  The patient expressed that his goal is to attended groups.The patient also said that he hopes to have a better day tomorrow. Octavio Manns 01/14/2018, 9:16 PM

## 2018-01-14 NOTE — [facility] Group Notes (Signed)
Pt was invited but did not attend orientation group facilitated by MHT AJ.  
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
## 2018-01-14 NOTE — Plan of Care (Signed)
Nurse discussed anxiety, depression and coping skills with patient.  

## 2018-01-14 NOTE — [facility] Group Notes (Signed)
Pt was invited but did not attend group facilitated by MHT AJ (Topic-Wellness). 
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
## 2018-01-15 MED ORDER — HYDROXYZINE HCL 50 MG PO TABS
50.0000 mg | ORAL_TABLET | Freq: Once | ORAL | Status: AC
  Administered 2018-01-15: 50 mg via ORAL
  Filled 2018-01-15 (×2): qty 1

## 2018-01-15 MED ORDER — ALPRAZOLAM 1 MG PO TABS
1.0000 mg | ORAL_TABLET | Freq: Three times a day (TID) | ORAL | Status: DC
  Administered 2018-01-16: 1 mg via ORAL
  Filled 2018-01-15: qty 1

## 2018-01-15 NOTE — [facility] Group Notes (Signed)
Adult Psychoeducational Group Note  Date:  01/15/2018 Time:  10:37 PM  Group Topic/Focus:  Wrap-Up Group:   The focus of this group is to help patients review their daily goal of treatment and discuss progress on daily workbooks.  Participation Level:  Active  Participation Quality:  Appropriate and Attentive  Affect:  Appropriate  Cognitive:  Alert and Appropriate  Insight: Appropriate and Good  Engagement in Group:  Engaged  Modes of Intervention:  Discussion and Education  Additional Comments:  Pt attended and participated in wrap up group this evening. Pt had a good day once they received the news that they are going home tomorrow. [facility] and [facility] their their regular home routine.   Glen NettersOctavia A Monta Maiorana 01/15/2018, 10:37 PM

## 2018-01-15 NOTE — [facility] Group Notes (Signed)
LCSW Group Therapy Note 01/15/2018 12:50 PM  Type of Therapy and Topic: Group Therapy: Overcoming Obstacles  Participation Level: Did Not Attend  Description of Group:  In this group patients will be encouraged to explore what they see as obstacles to their own wellness and recovery. They will be guided to discuss their thoughts, feelings, and behaviors related to these obstacles. The group will process together ways to cope with barriers, with attention given to specific choices patients can make. Each patient will be challenged to identify changes they are motivated to make in order to overcome their obstacles. This group will be process-oriented, with patients participating in exploration of their own experiences as well as giving and receiving support and challenge from other group members.  Therapeutic Goals: 1. Patient will identify personal and current obstacles as they relate to admission. 2. Patient will identify barriers that currently interfere with their wellness or overcoming obstacles.  3. Patient will identify feelings, thought process and behaviors related to these barriers. 4. Patient will identify two changes they are willing to make to overcome these obstacles:   Summary of Patient Progress  Invited, chose not to attend.    Therapeutic Modalities:  Cognitive Behavioral Therapy Solution Focused Therapy Motivational Interviewing Relapse Prevention Therapy   Saige Busby LCSWA Clinical Social Worker   

## 2018-01-15 NOTE — Plan of Care (Signed)
Progress note  D: pt found in bed; compliant with medication administration. Pt states he slept well. Pt rates his depression/hopelessness/anxiety a 0/0/0 out of 10 respectively. Pt denies any physical problems or pain, rating his pain a 0/10. Pt states his goal for today is to go home and he will achieve this by talking to the doctor. Pt denies si/hi/ah/vh and verbally agrees to approach staff if these become apparent or before harming [facility]. A; pt provided support and encouragement. Pt given medication per protocol and standing orders. [id number] safety checks implemented and continued.  R: pt safe on the unit. Will continue to monitor.   Pt progressing in the following metrics  Problem: Education: Goal: Mental status will improve Outcome: Progressing Goal: Verbalization of understanding the information provided will improve Outcome: Progressing   Problem: Activity: Goal: Sleeping patterns will improve Outcome: Progressing   Problem: Coping: Goal: Ability to verbalize frustrations and anger appropriately will improve Outcome: Progressing Goal: Ability to demonstrate self-control will improve Outcome: Progressing
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
## 2018-01-15 NOTE — Progress Notes (Signed)
Recreation Therapy Notes  Animal-Assisted Activity (AAA) Program Checklist/Progress Notes Patient Eligibility Criteria Checklist & Daily Group note for Rec Tx Intervention  Date: 11.12.19 Time: 1430 Location: 400 Hall Dayroom   AAA/T Program Assumption of Risk Form signed by Patient/ or Parent Legal Guardian YES   Patient is free of allergies or sever asthma  YES   Patient reports no fear of animals  YES  Patient reports no history of cruelty to animals YES   Patient understands his/her participation is voluntary YES      Patient washes hands before animal contact  YES   Patient washes hands after animal contact YES   Education: Hand Washing, Appropriate Animal Interaction   Education Outcome: Acknowledges understanding/In group clarification offered/Needs additional education.   Clinical Observations/Feedback: Pt did not attend activity.     Yaritzy Huser, LRT/CTRS         Merriel Zinger A 01/15/2018 3:21 PM 

## 2018-01-15 NOTE — Progress Notes (Signed)
After group time, pt approached writer asking for his evening medications as he wanted to go to bed early.  He seemed irritable and upset, so when writer asked if he was ok, he said that he was frustrated that he was still in the hospital.  He says that he expected to be discharged today and when that didn't happen, he became upset.  Pt denies SI/HI/AVH.  He voices no needs or concerns other than not being discharged today.  Support and encouragement offered.  Discharge plans are in process.  Pt was encouraged to speak with the CSW and doctor in the morning about his concerns.  Safety maintained with q15 minute checks.

## 2018-01-15 NOTE — [facility] Suicide Risk Assessment (Signed)
[facility] INPATIENT:  Family/Significant Other Suicide Prevention Education  Suicide Prevention Education:  Education Completed;Glen Jacobson, wife [phone]) has been identified by the patient as the family member/significant other with whom the patient will be residing, and identified as the person(s) who will aid the patient in the event of a mental health crisis (suicidal ideations/suicide attempt).  With written consent from the patient, the family member/significant other has been provided the following suicide prevention education, prior to the and/or following the discharge of the patient.  The suicide prevention education provided includes the following:  Suicide risk factors  Suicide prevention and interventions  National Suicide Hotline telephone number  [facility] assessment telephone number  [facility] Emergency Assistance 911  [facility] and/or [facility] Crisis [facility] telephone number  Request made of family/significant other to:  Remove weapons (e.g., guns, rifles, knives), all items previously/currently identified as safety concern.    Remove drugs/medications (over-the-counter, prescriptions, illicit drugs), all items previously/currently identified as a safety concern.  The family member/significant other verbalizes understanding of the suicide prevention education information provided.  The family member/significant other agrees to remove the items of safety concern listed above.  Glen Jacobson 01/15/2018, 3:04 PM

## 2018-01-16 MED ORDER — GABAPENTIN 600 MG PO TABS: 600.0000 mg | ORAL_TABLET | Freq: Three times a day (TID) | ORAL | 0 refills | Status: DC

## 2018-01-16 MED ORDER — METFORMIN HCL 500 MG PO TABS: 500.0000 mg | ORAL_TABLET | Freq: Every day | ORAL | Status: DC

## 2018-01-16 MED ORDER — HYDROXYZINE HCL 25 MG PO TABS: 25.0000 mg | ORAL_TABLET | Freq: Three times a day (TID) | ORAL | 0 refills | Status: DC | PRN

## 2018-01-16 MED ORDER — LURASIDONE HCL 60 MG PO TABS: 120.0000 mg | ORAL_TABLET | Freq: Every day | ORAL | 0 refills | Status: DC

## 2018-01-16 MED ORDER — DULOXETINE HCL 60 MG PO CPEP: 60.0000 mg | ORAL_CAPSULE | Freq: Two times a day (BID) | ORAL | 0 refills | Status: DC

## 2018-01-16 MED ORDER — LEVOTHYROXINE SODIUM 88 MCG PO TABS: ORAL_TABLET | ORAL | 0 refills | Status: DC

## 2018-01-16 MED ORDER — ATORVASTATIN CALCIUM 40 MG PO TABS: 40.0000 mg | ORAL_TABLET | Freq: Every day | ORAL | Status: DC

## 2018-01-16 MED ORDER — NICOTINE 21 MG/24HR TD PT24: 21.0000 mg | MEDICATED_PATCH | Freq: Every day | TRANSDERMAL | 0 refills | Status: DC

## 2018-01-16 MED ORDER — TRAZODONE HCL 50 MG PO TABS: 50.0000 mg | ORAL_TABLET | Freq: Every evening | ORAL | 0 refills | Status: DC | PRN

## 2018-01-16 MED ORDER — ALPRAZOLAM 1 MG PO TABS: 1.0000 mg | ORAL_TABLET | Freq: Three times a day (TID) | ORAL | 0 refills | Status: DC

## 2018-01-16 MED ORDER — OXCARBAZEPINE 150 MG PO TABS: ORAL_TABLET | ORAL | 0 refills | Status: DC
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
## 2018-01-16 NOTE — [facility] Group Notes (Signed)
[facility]      01/16/2018 10:34 AM  Type of Therapy: Mental Health Association Presentation  Participation Level: Did Not Participate    Summary of Progress/Problems: Invited, chose not to attend.    Alcario DroughtJolan Karrie Fluellen LCSWA Clinical Social Worker

## 2018-01-16 NOTE — Progress Notes (Signed)
  [facility] :  Will you be returning to the same living situation after discharge:  Yes,  patient is discharging home with his wife At discharge, do you have transportation home?: Yes,  patient's wife will pick him up at discharge Do you have the ability to pay for your medications: Yes,  [employer]  Release of information consent forms completed and in the chart;  Patient's signature needed at discharge.  Patient to Follow up at: Follow-up Information    [facility]. Go on 01/17/2018.   Specialty:  Behavioral Health Why:  Your hospital follow up appointment with Dr. Tenny Crawoss is Thursday November 14 at 1:00pm.  Contact information: [address] [place] [phone]       [facility]. Go on 01/28/2018.   Why:  Please attend your therapy appointment on Monday, 01/28/2018. Please call within 24 hours of discharge to confrim appointment and pay the $20 desposit.  Contact information: [address] [place] [phone]           Next level of care provider [facility]:yes  Safety Planning and Suicide Prevention discussed: Yes,  with the patient's wife  Have you used any form of tobacco in the last 30 days? (Cigarettes, Smokeless Tobacco, Cigars, and/or Pipes): Yes  Has patient been referred to the Quitline?: Patient refused referral  Patient has been referred for addiction treatment: N/A  Maeola SarahJolan E Randell Detter, LCSWA 01/16/2018, 10:43 AM

## 2018-01-16 NOTE — Progress Notes (Signed)
Patient ID: Glen Jacobson, male   DOB: [birth year], 42 y.o.   MRN: [id number]  D. Pt presents with a worried affect and anxious behavior. Pt holds mouth tight, body tense during interaction with this RN. Pt reports good sleep last night. Pt currently denies SI/HI and AVH and agrees to contact staff before acting on any harmful thoughts.   A. Labs and vitals monitored. Pt given and educated on medications. Pt supported emotionally and encouraged to express concerns and ask questions.    R. Pt remains safe with 15 minute checks. Reports Vistaril at nighttime was effective. Pt to discharge today to home. Will continue POC.

## 2018-01-16 NOTE — Progress Notes (Signed)
Patient ID: Glen ReeveJohn G Jacobson, male   DOB: [birth year], 42 y.o.   MRN: [id number]  Pt discharged to lobby. Pt was stable and appreciative at that time. All papers and prescriptions were given and valuables returned. Verbal understanding expressed. Denies SI/HI and A/VH. Pt given opportunity to express concerns and ask questions.

## 2018-01-16 NOTE — Progress Notes (Signed)
Pt reports that he is glad he will probably discharge home tomorrow.  He is not so happy about the changes that were made to his medications and was upset when he found he could not get a dose of Xanax at bedtime.  He feels he needs it more at the end of the day than at the beginning.  Writer spoke with the evening provider who ordered a one time dose of Vistaril 50 mg to be given at bedtime.  Pt was grateful and accepting to the Vistaril.  Pt denies SI/HI/AVH at this time.  Support and encouragement offered.  Discharge plans are in process.  Safety maintained with q15 minute checks.

## 2018-01-16 NOTE — [facility]HH Suicide Risk Assessment (Signed)
[facility] Discharge Suicide Risk Assessment   Principal Problem: Bipolar I disorder with mixed features [facility]) Discharge Diagnoses:  Patient Active Problem List   Diagnosis Date Noted  . Generalized anxiety disorder [F41.1] 01/12/2018  . Bipolar I disorder with mixed features (HCC) [F31.9] 01/11/2018  . HNP (herniated nucleus pulposus), lumbar [M51.26] 07/11/2017  . Current smoker [F17.200] 08/04/2016  . Other specified hypothyroidism [E03.8] 03/29/2015  . Pre-diabetes [R73.03] 03/29/2015  . Mixed hyperlipidemia [E78.2] 03/29/2015  . Vitamin D deficiency [E55.9] 03/29/2015  . ADHD (attention deficit hyperactivity disorder), combined type [F90.2] 07/24/2014  . Bipolar I disorder, most recent episode (or current) mixed, moderate [F31.62] 05/29/2013  . 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 Herniated lumbar intervertebral disc [M51.26] 04/02/2013  . Spinal stenosis, lumbar [M48.061] 04/02/2013    Total Time spent with patient: 30 minutes  Musculoskeletal: Strength & Muscle Tone: within normal limits Gait & Station: normal Patient leans: N/A  Psychiatric Specialty Exam: ROS no headache, no visual disturbances, no chest pain, no shortness of breath, no vomiting   Blood pressure 115/84, pulse (!) 116, temperature 98.3 F (36.8 C), temperature source Oral, resp. rate 16, height 5\' 11"  (1.803 m), weight 114.8 kg, SpO2 100 %.Body mass index is 35.29 kg/m.  General Appearance: Well Groomed  Eye Contact::  Good  Speech:  Normal Rate409  Volume:  Normal  Mood:  improved, states " I feel a lot better"  Affect:  more reactive   Thought Process:  Linear and Descriptions of Associations: Intact  Orientation:  Other:  fully alert and attentive  Thought Content:  no hallucinations, no delusions, not internally preoccupied   Suicidal Thoughts:  No denies suicidal or self injurious ideations, denies homicidal or violent ideations  Homicidal Thoughts:  No   Memory:  recent and remote grossly intact   Judgement:  Other:  improving   Insight: fair/ improving   Psychomotor Activity:  Normal- no tremors, no diaphoresis, no psychomotor agitation  Concentration:  Good  Recall:  Good  Fund of Knowledge:Good  Language: Good  Akathisia:  Negative  Handed:  Right  AIMS (if indicated):     Assets:  Communication Skills Desire for Improvement Resilience  Sleep:  Number of Hours: 6.5  Cognition: WNL  ADL's:  Intact   Mental Status Per Nursing Assessment::   On Admission:  NA  Demographic Factors:  42, married, lives with wife and grandchild, currently unemployed   Loss Factors: Work related stressors  Historical Factors: Reports history of mood disorder, has been diagnosed with Bipolar Disorder , ADHD in the past .  Reports remote history of alcohol use disorder   Risk Reduction Factors:   Responsible for children under 37 years of age, Sense of responsibility to family, Living with another person, especially a relative and Positive coping skills or problem solving skills  Continued Clinical Symptoms:  At this time patient is alert, attentive, well groomed, mood is described as improved, affect is somewhat blunted, but improves during session. 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 Has tolerated Xanax taper - now on 1 mgr TID - well , with no current symptoms of WDL.( Of note, reports he had been taking at 2 mgrs daily prior  to admission- will discuss further gradual taper with his outpatient psychiatrist )  No disruptive or agitated behaviors on unit   Cognitive Features That Contribute To Risk:  No gross cognitive deficits noted upon discharge. Is alert  , attentive, and oriented x 3   Suicide Risk:  Mild:  Suicidal ideation of limited frequency, intensity, duration, and specificity.  There are no identifiable plans, no associated intent, mild dysphoria and related symptoms, good self-control (both objective and subjective assessment), few other risk factors, and identifiable protective factors, including available and accessible social support.  Follow-up Information    [facility]. Go on 01/17/2018.   Specialty:  Behavioral Health Why:  Your hospital follow up appointment with Dr. Tenny Crawoss is Thursday November 14 at 1:00pm.  Contact information: [address] [place] [id number] [phone]       [facility]. Go on 01/28/2018.   Why:  Please attend your therapy appointment on Monday, 01/28/2018. Please call within 24 hours of discharge to confrim appointment and pay the $20 desposit.  Contact information: [address] [place] [id number] [phone]           Plan Of Care/Follow-up recommendations:  Activity:  as tolerated  Diet:  heart healthy diet  Tests:  NA Other:  See below  Patient is expressing readiness for discharge, no grounds for involuntary commitment at this time  Plans to return home Plans to follow up as above  [facility] for medical management as needed   Craige CottaFernando A Valaree Fresquez, MD 01/16/2018, 11:39 AM

## 2018-01-17 ENCOUNTER — Ambulatory Visit (INDEPENDENT_AMBULATORY_CARE_PROVIDER_SITE_OTHER): Payer: 59 | Admitting: Psychiatry

## 2018-01-17 ENCOUNTER — Encounter (HOSPITAL_COMMUNITY): Payer: Self-pay | Admitting: Psychiatry

## 2018-01-17 VITALS — BP 120/87 | HR 115 | Ht 71.0 in | Wt 259.0 lb

## 2018-01-17 DIAGNOSIS — F3162 Bipolar disorder, current episode mixed, moderate: Secondary | ICD-10-CM

## 2018-01-17 NOTE — Patient Instructions (Signed)
Gabapentin-- take one half tablet in the am and one tablet at bedtime
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
## 2018-01-20 DIAGNOSIS — J020 Streptococcal pharyngitis: Secondary | ICD-10-CM | POA: Diagnosis not present

## 2018-01-20 DIAGNOSIS — J0100 Acute maxillary sinusitis, unspecified: Secondary | ICD-10-CM | POA: Diagnosis not present

## 2018-02-07 ENCOUNTER — Encounter (HOSPITAL_COMMUNITY): Payer: Self-pay | Admitting: Psychiatry

## 2018-02-07 ENCOUNTER — Ambulatory Visit: Payer: 59 | Admitting: "Endocrinology

## 2018-02-07 ENCOUNTER — Ambulatory Visit (HOSPITAL_COMMUNITY): Payer: 59 | Admitting: Psychiatry

## 2018-02-07 VITALS — BP 123/87 | HR 93 | Ht 71.0 in | Wt 268.0 lb

## 2018-02-07 DIAGNOSIS — F3162 Bipolar disorder, current episode mixed, moderate: Secondary | ICD-10-CM | POA: Diagnosis not present

## 2018-02-07 MED ORDER — GABAPENTIN 600 MG PO TABS: 900.0000 mg | ORAL_TABLET | Freq: Every day | ORAL | 2 refills | Status: DC

## 2018-02-07 MED ORDER — HYDROXYZINE HCL 25 MG PO TABS: 25.0000 mg | ORAL_TABLET | Freq: Three times a day (TID) | ORAL | 2 refills | Status: DC | PRN

## 2018-02-07 MED ORDER — ALPRAZOLAM 1 MG PO TABS: 1.0000 mg | ORAL_TABLET | Freq: Three times a day (TID) | ORAL | 2 refills | Status: DC

## 2018-02-07 MED ORDER — TRAZODONE HCL 50 MG PO TABS: 50.0000 mg | ORAL_TABLET | Freq: Every evening | ORAL | 2 refills | Status: DC | PRN

## 2018-02-07 MED ORDER — DULOXETINE HCL 60 MG PO CPEP: 60.0000 mg | ORAL_CAPSULE | Freq: Two times a day (BID) | ORAL | 2 refills | Status: DC
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
## 2018-02-07 NOTE — Patient Instructions (Signed)
Taper off Trileptal Taper off Latuda-- 60 mg daily then stop Take all gabapentin at night Walk daily

## 2018-02-12 DIAGNOSIS — M545 Low back pain: Secondary | ICD-10-CM | POA: Diagnosis not present

## 2018-02-12 DIAGNOSIS — M5136 Other intervertebral disc degeneration, lumbar region: Secondary | ICD-10-CM | POA: Diagnosis not present

## 2018-02-12 DIAGNOSIS — Z981 Arthrodesis status: Secondary | ICD-10-CM | POA: Diagnosis not present

## 2018-02-12 DIAGNOSIS — M47816 Spondylosis without myelopathy or radiculopathy, lumbar region: Secondary | ICD-10-CM | POA: Diagnosis not present

## 2018-02-17 ENCOUNTER — Other Ambulatory Visit: Payer: Self-pay | Admitting: "Endocrinology

## 2018-02-20 ENCOUNTER — Other Ambulatory Visit: Payer: Self-pay | Admitting: "Endocrinology

## 2018-02-20 ENCOUNTER — Encounter (HOSPITAL_COMMUNITY): Payer: Self-pay | Admitting: Psychiatry

## 2018-02-20 ENCOUNTER — Ambulatory Visit (HOSPITAL_COMMUNITY): Payer: 59 | Admitting: Psychiatry

## 2018-02-20 VITALS — BP 122/73 | HR 92 | Ht 71.0 in | Wt 274.0 lb

## 2018-02-20 DIAGNOSIS — R7303 Prediabetes: Secondary | ICD-10-CM | POA: Diagnosis not present

## 2018-02-20 DIAGNOSIS — F902 Attention-deficit hyperactivity disorder, combined type: Secondary | ICD-10-CM

## 2018-02-20 DIAGNOSIS — E038 Other specified hypothyroidism: Secondary | ICD-10-CM | POA: Diagnosis not present

## 2018-02-20 DIAGNOSIS — F3162 Bipolar disorder, current episode mixed, moderate: Secondary | ICD-10-CM | POA: Diagnosis not present

## 2018-02-20 MED ORDER — CARIPRAZINE HCL 3 MG PO CAPS: 3.0000 mg | ORAL_CAPSULE | Freq: Every day | ORAL | 2 refills | Status: DC
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
## 2018-02-21 LAB — T4, FREE: FREE T4: 1.19 ng/dL (ref 0.82–1.77)

## 2018-02-21 LAB — COMPREHENSIVE METABOLIC PANEL
ALT: 27 IU/L (ref 0–44)
AST: 21 IU/L (ref 0–40)
Albumin/Globulin Ratio: 1.8 (ref 1.2–2.2)
Albumin: 4.7 g/dL (ref 3.5–5.5)
Alkaline Phosphatase: 110 IU/L (ref 39–117)
BUN/Creatinine Ratio: 11 (ref 9–20)
BUN: 11 mg/dL (ref 6–24)
Bilirubin Total: 0.5 mg/dL (ref 0.0–1.2)
CALCIUM: 10.1 mg/dL (ref 8.7–10.2)
CO2: 25 mmol/L (ref 20–29)
Chloride: 97 mmol/L (ref 96–106)
Creatinine, Ser: 1.01 mg/dL (ref 0.76–1.27)
GFR calc Af Amer: 105 mL/min/{1.73_m2} (ref >59)
GFR, EST NON AFRICAN AMERICAN: 91 mL/min/{1.73_m2} (ref >59)
Globulin, Total: 2.6 g/dL (ref 1.5–4.5)
Glucose: 82 mg/dL (ref 65–99)
POTASSIUM: 4.1 mmol/L (ref 3.5–5.2)
Sodium: 139 mmol/L (ref 134–144)
Total Protein: 7.3 g/dL (ref 6.0–8.5)

## 2018-02-21 LAB — HGB A1C W/O EAG: Hgb A1c MFr Bld: 6 % — ABNORMAL HIGH (ref 4.8–5.6)

## 2018-02-21 LAB — VITAMIN D 25 HYDROXY (VIT D DEFICIENCY, FRACTURES): Vit D, 25-Hydroxy: 18.8 ng/mL — ABNORMAL LOW (ref 30.0–100.0)

## 2018-02-21 LAB — SPECIMEN STATUS REPORT

## 2018-02-21 LAB — TSH: TSH: 4.82 u[IU]/mL — AB (ref 0.450–4.500)

## 2018-03-07 ENCOUNTER — Ambulatory Visit: Payer: 59 | Admitting: "Endocrinology

## 2018-03-07 ENCOUNTER — Encounter: Payer: Self-pay | Admitting: "Endocrinology

## 2018-03-07 VITALS — BP 136/82 | HR 98 | Ht 71.0 in | Wt 282.0 lb

## 2018-03-07 DIAGNOSIS — E782 Mixed hyperlipidemia: Secondary | ICD-10-CM

## 2018-03-07 DIAGNOSIS — R7303 Prediabetes: Secondary | ICD-10-CM | POA: Diagnosis not present

## 2018-03-07 DIAGNOSIS — E559 Vitamin D deficiency, unspecified: Secondary | ICD-10-CM

## 2018-03-07 DIAGNOSIS — E038 Other specified hypothyroidism: Secondary | ICD-10-CM | POA: Diagnosis not present

## 2018-03-07 MED ORDER — VITAMIN D3 125 MCG (5000 UT) PO CAPS: 5000.0000 [IU] | ORAL_CAPSULE | Freq: Every day | ORAL | 0 refills | Status: DC

## 2018-03-07 MED ORDER — LEVOTHYROXINE SODIUM 112 MCG PO TABS: ORAL_TABLET | ORAL | 3 refills | Status: DC

## 2018-03-07 NOTE — Patient Instructions (Signed)
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
## 2018-03-26 ENCOUNTER — Ambulatory Visit (HOSPITAL_COMMUNITY): Payer: 59 | Admitting: Psychiatry

## 2018-04-03 ENCOUNTER — Ambulatory Visit (HOSPITAL_COMMUNITY): Payer: 59 | Admitting: Psychiatry

## 2018-04-03 ENCOUNTER — Encounter (HOSPITAL_COMMUNITY): Payer: Self-pay | Admitting: Psychiatry

## 2018-04-03 VITALS — BP 136/90 | HR 96 | Ht 71.0 in | Wt 283.0 lb

## 2018-04-03 DIAGNOSIS — F3162 Bipolar disorder, current episode mixed, moderate: Secondary | ICD-10-CM | POA: Diagnosis not present

## 2018-04-03 MED ORDER — ALPRAZOLAM 1 MG PO TABS: 1.0000 mg | ORAL_TABLET | Freq: Three times a day (TID) | ORAL | 2 refills | Status: DC

## 2018-04-03 MED ORDER — GABAPENTIN 600 MG PO TABS: 900.0000 mg | ORAL_TABLET | Freq: Every day | ORAL | 2 refills | Status: DC

## 2018-04-03 MED ORDER — HYDROXYZINE HCL 25 MG PO TABS: 25.0000 mg | ORAL_TABLET | Freq: Three times a day (TID) | ORAL | 2 refills | Status: DC | PRN

## 2018-04-03 MED ORDER — TEMAZEPAM 30 MG PO CAPS: 30.0000 mg | ORAL_CAPSULE | Freq: Every evening | ORAL | 2 refills | Status: DC | PRN

## 2018-04-03 MED ORDER — VORTIOXETINE HBR 20 MG PO TABS: 20.0000 mg | ORAL_TABLET | Freq: Every day | ORAL | 2 refills | Status: DC
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
## 2018-04-03 NOTE — Patient Instructions (Signed)
Start trintellix 5 mg daily for one week, then increase to 10 mg daily Cut cymbalta down to one pill at a day for one week , then stop

## 2018-05-05 ENCOUNTER — Other Ambulatory Visit (HOSPITAL_COMMUNITY): Payer: Self-pay | Admitting: Psychiatry

## 2018-05-14 ENCOUNTER — Other Ambulatory Visit (HOSPITAL_COMMUNITY): Payer: Self-pay | Admitting: Psychiatry

## 2018-05-17 ENCOUNTER — Other Ambulatory Visit (HOSPITAL_COMMUNITY): Payer: Self-pay | Admitting: Psychiatry

## 2018-05-29 ENCOUNTER — Other Ambulatory Visit: Payer: Self-pay | Admitting: "Endocrinology

## 2018-06-04 ENCOUNTER — Telehealth (HOSPITAL_COMMUNITY): Payer: Self-pay | Admitting: *Deleted

## 2018-06-04 NOTE — Telephone Encounter (Signed)
Dr Tenny Craw Patient called "he had a allergic reaction to the anti-depressant. He has stopped taking the medication. Patient has also started back taking his old medication Duloxetine 60 mg 2 x/day

## 2018-06-04 NOTE — Telephone Encounter (Signed)
Does he have enough?

## 2018-06-04 NOTE — Telephone Encounter (Signed)
Dr Tenny Craw Patient says yes he should have enough until he sees you again. I told him to call us if anything changes.

## 2018-06-20 DIAGNOSIS — E119 Type 2 diabetes mellitus without complications: Secondary | ICD-10-CM | POA: Diagnosis not present

## 2018-06-20 DIAGNOSIS — I10 Essential (primary) hypertension: Secondary | ICD-10-CM | POA: Diagnosis not present

## 2018-06-20 DIAGNOSIS — Z6841 Body Mass Index (BMI) 40.0 and over, adult: Secondary | ICD-10-CM | POA: Diagnosis not present

## 2018-06-20 DIAGNOSIS — Z1389 Encounter for screening for other disorder: Secondary | ICD-10-CM | POA: Diagnosis not present

## 2018-06-27 ENCOUNTER — Other Ambulatory Visit: Payer: Self-pay | Admitting: "Endocrinology

## 2018-07-04 ENCOUNTER — Encounter (HOSPITAL_COMMUNITY): Payer: Self-pay | Admitting: Psychiatry

## 2018-07-04 ENCOUNTER — Other Ambulatory Visit: Payer: Self-pay

## 2018-07-04 ENCOUNTER — Ambulatory Visit (INDEPENDENT_AMBULATORY_CARE_PROVIDER_SITE_OTHER): Payer: 59 | Admitting: Psychiatry

## 2018-07-04 DIAGNOSIS — F3162 Bipolar disorder, current episode mixed, moderate: Secondary | ICD-10-CM | POA: Diagnosis not present

## 2018-07-04 MED ORDER — CARIPRAZINE HCL 3 MG PO CAPS: 3.0000 mg | ORAL_CAPSULE | Freq: Every day | ORAL | 2 refills | Status: DC

## 2018-07-04 MED ORDER — GABAPENTIN 600 MG PO TABS: 900.0000 mg | ORAL_TABLET | Freq: Every day | ORAL | 2 refills | Status: DC

## 2018-07-04 MED ORDER — ALPRAZOLAM 1 MG PO TABS: 1.0000 mg | ORAL_TABLET | Freq: Three times a day (TID) | ORAL | 2 refills | Status: DC

## 2018-07-04 MED ORDER — HYDROXYZINE HCL 25 MG PO TABS: 25.0000 mg | ORAL_TABLET | Freq: Three times a day (TID) | ORAL | 2 refills | Status: DC | PRN

## 2018-07-04 MED ORDER — DULOXETINE HCL 60 MG PO CPEP: 60.0000 mg | ORAL_CAPSULE | Freq: Two times a day (BID) | ORAL | 2 refills | Status: DC

## 2018-07-04 MED ORDER — TEMAZEPAM 30 MG PO CAPS: 30.0000 mg | ORAL_CAPSULE | Freq: Every evening | ORAL | 2 refills | Status: DC | PRN
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
## 2018-07-08 ENCOUNTER — Ambulatory Visit: Payer: 59 | Admitting: "Endocrinology

## 2018-07-08 ENCOUNTER — Other Ambulatory Visit (HOSPITAL_COMMUNITY): Payer: Self-pay | Admitting: Psychiatry

## 2018-07-08 MED ORDER — CARIPRAZINE HCL 3 MG PO CAPS: 3.0000 mg | ORAL_CAPSULE | Freq: Every day | ORAL | 2 refills | Status: DC

## 2018-07-11 ENCOUNTER — Other Ambulatory Visit (HOSPITAL_COMMUNITY): Payer: Self-pay | Admitting: Psychiatry

## 2018-07-11 MED ORDER — CARIPRAZINE HCL 3 MG PO CAPS: 3.0000 mg | ORAL_CAPSULE | Freq: Every day | ORAL | 2 refills | Status: DC

## 2018-07-12 ENCOUNTER — Other Ambulatory Visit (HOSPITAL_COMMUNITY): Payer: Self-pay | Admitting: Psychiatry

## 2018-07-25 ENCOUNTER — Other Ambulatory Visit: Payer: Self-pay | Admitting: "Endocrinology

## 2018-07-30 ENCOUNTER — Telehealth (HOSPITAL_COMMUNITY): Payer: Self-pay | Admitting: *Deleted

## 2018-07-30 NOTE — Telephone Encounter (Signed)
Called & spoke with patient asked if he received mailed R.O. I. sent on  07-10-2018? "Patient stated he has received BUT he's not going to have the Bariatric Surgery"

## 2018-08-18 ENCOUNTER — Other Ambulatory Visit (HOSPITAL_COMMUNITY): Payer: Self-pay | Admitting: Psychiatry

## 2018-08-26 ENCOUNTER — Other Ambulatory Visit (HOSPITAL_COMMUNITY): Payer: Self-pay | Admitting: Psychiatry

## 2018-08-26 MED ORDER — CARIPRAZINE HCL 3 MG PO CAPS: 3.0000 mg | ORAL_CAPSULE | Freq: Every day | ORAL | 2 refills | Status: DC

## 2018-09-15 ENCOUNTER — Other Ambulatory Visit: Payer: Self-pay | Admitting: "Endocrinology

## 2018-09-16 ENCOUNTER — Other Ambulatory Visit (HOSPITAL_COMMUNITY): Payer: Self-pay | Admitting: Psychiatry

## 2018-10-07 ENCOUNTER — Ambulatory Visit (HOSPITAL_COMMUNITY): Payer: 59 | Admitting: Psychiatry

## 2018-10-08 ENCOUNTER — Ambulatory Visit (INDEPENDENT_AMBULATORY_CARE_PROVIDER_SITE_OTHER): Payer: 59 | Admitting: Psychiatry

## 2018-10-08 ENCOUNTER — Encounter (HOSPITAL_COMMUNITY): Payer: Self-pay | Admitting: Psychiatry

## 2018-10-08 ENCOUNTER — Other Ambulatory Visit: Payer: Self-pay

## 2018-10-08 DIAGNOSIS — F3162 Bipolar disorder, current episode mixed, moderate: Secondary | ICD-10-CM | POA: Diagnosis not present

## 2018-10-08 DIAGNOSIS — G4700 Insomnia, unspecified: Secondary | ICD-10-CM | POA: Diagnosis not present

## 2018-10-08 MED ORDER — TRAZODONE HCL 100 MG PO TABS: ORAL_TABLET | ORAL | 2 refills | Status: DC

## 2018-10-08 MED ORDER — GABAPENTIN 600 MG PO TABS: 900.0000 mg | ORAL_TABLET | Freq: Every day | ORAL | 2 refills | Status: DC

## 2018-10-08 MED ORDER — DULOXETINE HCL 60 MG PO CPEP: 60.0000 mg | ORAL_CAPSULE | Freq: Two times a day (BID) | ORAL | 2 refills | Status: DC

## 2018-10-08 MED ORDER — ALPRAZOLAM 1 MG PO TABS: 1.0000 mg | ORAL_TABLET | Freq: Three times a day (TID) | ORAL | 2 refills | Status: DC

## 2018-10-08 MED ORDER — HYDROXYZINE HCL 25 MG PO TABS: 25.0000 mg | ORAL_TABLET | Freq: Three times a day (TID) | ORAL | 2 refills | Status: DC | PRN

## 2018-10-08 MED ORDER — CARIPRAZINE HCL 3 MG PO CAPS: 3.0000 mg | ORAL_CAPSULE | Freq: Every day | ORAL | 2 refills | Status: DC
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
## 2018-10-14 ENCOUNTER — Ambulatory Visit (HOSPITAL_COMMUNITY): Payer: 59 | Admitting: Psychiatry

## 2018-10-21 ENCOUNTER — Emergency Department (HOSPITAL_COMMUNITY)
Admission: EM | Admit: 2018-10-21 | Discharge: 2018-10-21 | Disposition: A | Discharge status: Home / Self Care | Payer: 59 | Attending: Emergency Medicine | Admitting: Emergency Medicine

## 2018-10-21 ENCOUNTER — Other Ambulatory Visit: Payer: Self-pay

## 2018-10-21 DIAGNOSIS — Y999 Unspecified external cause status: Secondary | ICD-10-CM | POA: Diagnosis not present

## 2018-10-21 DIAGNOSIS — I10 Essential (primary) hypertension: Secondary | ICD-10-CM | POA: Diagnosis not present

## 2018-10-21 DIAGNOSIS — F17290 Nicotine dependence, other tobacco product, uncomplicated: Secondary | ICD-10-CM | POA: Diagnosis not present

## 2018-10-21 DIAGNOSIS — Z7984 Long term (current) use of oral hypoglycemic drugs: Secondary | ICD-10-CM | POA: Diagnosis not present

## 2018-10-21 DIAGNOSIS — Z79899 Other long term (current) drug therapy: Secondary | ICD-10-CM | POA: Diagnosis not present

## 2018-10-21 DIAGNOSIS — M545 Low back pain: Secondary | ICD-10-CM | POA: Diagnosis present

## 2018-10-21 DIAGNOSIS — E039 Hypothyroidism, unspecified: Secondary | ICD-10-CM | POA: Insufficient documentation

## 2018-10-21 DIAGNOSIS — Y9389 Activity, other specified: Secondary | ICD-10-CM | POA: Diagnosis not present

## 2018-10-21 DIAGNOSIS — Y929 Unspecified place or not applicable: Secondary | ICD-10-CM | POA: Insufficient documentation

## 2018-10-21 DIAGNOSIS — M5442 Lumbago with sciatica, left side: Secondary | ICD-10-CM | POA: Diagnosis not present

## 2018-10-21 DIAGNOSIS — E119 Type 2 diabetes mellitus without complications: Secondary | ICD-10-CM | POA: Diagnosis not present

## 2018-10-21 DIAGNOSIS — N50819 Testicular pain, unspecified: Secondary | ICD-10-CM | POA: Insufficient documentation

## 2018-10-21 DIAGNOSIS — X509XXA Other and unspecified overexertion or strenuous movements or postures, initial encounter: Secondary | ICD-10-CM | POA: Diagnosis not present

## 2018-10-21 MED ORDER — KETOROLAC TROMETHAMINE 30 MG/ML IJ SOLN
30.0000 mg | Freq: Once | INTRAMUSCULAR | Status: AC
  Administered 2018-10-21: 30 mg via INTRAMUSCULAR
  Filled 2018-10-21: qty 1

## 2018-10-21 MED ORDER — TRAMADOL HCL 50 MG PO TABS: 50.0000 mg | ORAL_TABLET | Freq: Four times a day (QID) | ORAL | 0 refills | Status: DC | PRN

## 2018-10-21 MED ORDER — METHOCARBAMOL 500 MG PO TABS
1000.0000 mg | ORAL_TABLET | Freq: Once | ORAL | Status: AC
  Administered 2018-10-21: 1000 mg via ORAL
  Filled 2018-10-21: qty 2

## 2018-10-21 MED ORDER — OXYCODONE-ACETAMINOPHEN 5-325 MG PO TABS
1.0000 | ORAL_TABLET | Freq: Once | ORAL | Status: AC
  Administered 2018-10-21: 1 via ORAL
  Filled 2018-10-21: qty 1

## 2018-10-21 MED ORDER — METHOCARBAMOL 500 MG PO TABS: 500.0000 mg | ORAL_TABLET | Freq: Every evening | ORAL | 0 refills | Status: DC | PRN
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
## 2018-10-21 NOTE — ED Notes (Signed)
Pt reports pain in the L4-5 region of back where he had a spinal fusion. Pt able to ambulate but has increased pain with ROM with both legs. He is experiencing left leg numbness/tingling.

## 2018-10-21 NOTE — ED Triage Notes (Signed)
Patient arrived POV due to having back pain x2days Patient states that he was assisting someone by picking up a couch and when he turned, he heard a "popping sound". Patient rates pain a 10/10. Attempted to see his back surgeon (had last back surgery in 2019), but surgeon was unable to see him at office.

## 2018-10-21 NOTE — ED Notes (Signed)
Patient verbalizes understanding of discharge instructions. Opportunity for questioning and answers were provided. Armband removed by staff, pt discharged from ED.  

## 2018-10-21 NOTE — Discharge Instructions (Signed)
Continue Aleve 500mg  twice daily for pain and inflammation Take Robaxin 500-1000mg  for muscle spasms and pain Take Tramadol for severe pain - this medication can make you sleepy Use heat for stiff/sore muscles

## 2018-11-08 ENCOUNTER — Other Ambulatory Visit: Payer: Self-pay

## 2018-11-08 ENCOUNTER — Ambulatory Visit (INDEPENDENT_AMBULATORY_CARE_PROVIDER_SITE_OTHER): Payer: 59 | Admitting: Psychiatry

## 2018-11-08 ENCOUNTER — Encounter (HOSPITAL_COMMUNITY): Payer: Self-pay | Admitting: Psychiatry

## 2018-11-08 DIAGNOSIS — F3162 Bipolar disorder, current episode mixed, moderate: Secondary | ICD-10-CM | POA: Diagnosis not present

## 2018-11-08 MED ORDER — HYDROXYZINE HCL 25 MG PO TABS: 25.0000 mg | ORAL_TABLET | Freq: Three times a day (TID) | ORAL | 2 refills | Status: DC | PRN

## 2018-11-08 MED ORDER — TRAZODONE HCL 100 MG PO TABS: ORAL_TABLET | ORAL | 2 refills | Status: DC

## 2018-11-08 MED ORDER — GABAPENTIN 600 MG PO TABS: 900.0000 mg | ORAL_TABLET | Freq: Every day | ORAL | 2 refills | Status: DC

## 2018-11-08 MED ORDER — CARIPRAZINE HCL 3 MG PO CAPS: 3.0000 mg | ORAL_CAPSULE | Freq: Every day | ORAL | 2 refills | Status: DC

## 2018-11-08 MED ORDER — DULOXETINE HCL 60 MG PO CPEP: 60.0000 mg | ORAL_CAPSULE | Freq: Two times a day (BID) | ORAL | 2 refills | Status: DC

## 2018-11-08 MED ORDER — ALPRAZOLAM 1 MG PO TABS: 1.0000 mg | ORAL_TABLET | Freq: Three times a day (TID) | ORAL | 2 refills | Status: DC
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
## 2018-12-12 ENCOUNTER — Other Ambulatory Visit (HOSPITAL_COMMUNITY): Payer: Self-pay | Admitting: Psychiatry

## 2019-01-08 ENCOUNTER — Other Ambulatory Visit: Payer: Self-pay | Admitting: "Endocrinology

## 2019-01-13 ENCOUNTER — Encounter (HOSPITAL_COMMUNITY): Payer: Self-pay | Admitting: Psychiatry

## 2019-01-13 ENCOUNTER — Ambulatory Visit (INDEPENDENT_AMBULATORY_CARE_PROVIDER_SITE_OTHER): Payer: 59 | Admitting: Psychiatry

## 2019-01-13 ENCOUNTER — Other Ambulatory Visit: Payer: Self-pay

## 2019-01-13 DIAGNOSIS — F902 Attention-deficit hyperactivity disorder, combined type: Secondary | ICD-10-CM | POA: Diagnosis not present

## 2019-01-13 DIAGNOSIS — F3162 Bipolar disorder, current episode mixed, moderate: Secondary | ICD-10-CM

## 2019-01-13 MED ORDER — GABAPENTIN 600 MG PO TABS: 900.0000 mg | ORAL_TABLET | Freq: Every day | ORAL | 2 refills | Status: DC

## 2019-01-13 MED ORDER — TRAZODONE HCL 100 MG PO TABS: ORAL_TABLET | ORAL | 2 refills | Status: DC

## 2019-01-13 MED ORDER — ALPRAZOLAM 1 MG PO TABS: 1.0000 mg | ORAL_TABLET | Freq: Three times a day (TID) | ORAL | 2 refills | Status: DC

## 2019-01-13 MED ORDER — HYDROXYZINE HCL 25 MG PO TABS: 25.0000 mg | ORAL_TABLET | Freq: Three times a day (TID) | ORAL | 2 refills | Status: DC | PRN

## 2019-01-13 MED ORDER — DULOXETINE HCL 60 MG PO CPEP: 60.0000 mg | ORAL_CAPSULE | Freq: Two times a day (BID) | ORAL | 2 refills | Status: DC

## 2019-01-13 MED ORDER — CARIPRAZINE HCL 3 MG PO CAPS: 3.0000 mg | ORAL_CAPSULE | Freq: Every day | ORAL | 2 refills | Status: DC
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
## 2019-04-15 ENCOUNTER — Ambulatory Visit (INDEPENDENT_AMBULATORY_CARE_PROVIDER_SITE_OTHER): Payer: 59 | Admitting: Psychiatry

## 2019-04-15 ENCOUNTER — Other Ambulatory Visit: Payer: Self-pay

## 2019-04-15 ENCOUNTER — Encounter (HOSPITAL_COMMUNITY): Payer: Self-pay | Admitting: Psychiatry

## 2019-04-15 DIAGNOSIS — F3162 Bipolar disorder, current episode mixed, moderate: Secondary | ICD-10-CM | POA: Diagnosis not present

## 2019-04-15 DIAGNOSIS — F902 Attention-deficit hyperactivity disorder, combined type: Secondary | ICD-10-CM | POA: Diagnosis not present

## 2019-04-15 MED ORDER — HYDROXYZINE HCL 25 MG PO TABS: 25.0000 mg | ORAL_TABLET | Freq: Three times a day (TID) | ORAL | 2 refills | Status: DC | PRN

## 2019-04-15 MED ORDER — GABAPENTIN 600 MG PO TABS: 900.0000 mg | ORAL_TABLET | Freq: Every day | ORAL | 2 refills | Status: DC

## 2019-04-15 MED ORDER — ALPRAZOLAM 1 MG PO TABS: 1.0000 mg | ORAL_TABLET | Freq: Three times a day (TID) | ORAL | 2 refills | Status: DC

## 2019-04-15 MED ORDER — DULOXETINE HCL 60 MG PO CPEP: 60.0000 mg | ORAL_CAPSULE | Freq: Two times a day (BID) | ORAL | 2 refills | Status: DC

## 2019-04-15 MED ORDER — TRAZODONE HCL 100 MG PO TABS: ORAL_TABLET | ORAL | 2 refills | Status: DC

## 2019-04-15 MED ORDER — CARIPRAZINE HCL 3 MG PO CAPS: 3.0000 mg | ORAL_CAPSULE | Freq: Every day | ORAL | 2 refills | Status: DC
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
## 2019-07-02 ENCOUNTER — Other Ambulatory Visit (HOSPITAL_COMMUNITY): Payer: Self-pay | Admitting: Psychiatry

## 2019-07-02 ENCOUNTER — Other Ambulatory Visit: Payer: Self-pay | Admitting: "Endocrinology

## 2019-07-14 ENCOUNTER — Encounter (HOSPITAL_COMMUNITY): Payer: Self-pay | Admitting: Psychiatry

## 2019-07-14 ENCOUNTER — Other Ambulatory Visit: Payer: Self-pay

## 2019-07-14 ENCOUNTER — Telehealth (HOSPITAL_COMMUNITY): Payer: Self-pay

## 2019-07-14 ENCOUNTER — Telehealth (INDEPENDENT_AMBULATORY_CARE_PROVIDER_SITE_OTHER): Payer: 59 | Admitting: Psychiatry

## 2019-07-14 DIAGNOSIS — F3162 Bipolar disorder, current episode mixed, moderate: Secondary | ICD-10-CM

## 2019-07-14 MED ORDER — FLUOXETINE HCL 40 MG PO CAPS
40.0000 mg | ORAL_CAPSULE | Freq: Every day | ORAL | 2 refills | Status: DC
Start: 2019-07-14 — End: 2019-07-29

## 2019-07-14 MED ORDER — TRAZODONE HCL 100 MG PO TABS: ORAL_TABLET | ORAL | 2 refills | Status: DC

## 2019-07-14 MED ORDER — GABAPENTIN 600 MG PO TABS: 900.0000 mg | ORAL_TABLET | Freq: Every day | ORAL | 2 refills | Status: DC

## 2019-07-14 MED ORDER — HYDROXYZINE HCL 25 MG PO TABS: 25.0000 mg | ORAL_TABLET | Freq: Three times a day (TID) | ORAL | 2 refills | Status: DC | PRN

## 2019-07-14 MED ORDER — CARIPRAZINE HCL 3 MG PO CAPS: 3.0000 mg | ORAL_CAPSULE | Freq: Every day | ORAL | 2 refills | Status: DC

## 2019-07-14 MED ORDER — ALPRAZOLAM 1 MG PO TABS: 1.0000 mg | ORAL_TABLET | Freq: Three times a day (TID) | ORAL | 2 refills | Status: DC

## 2019-07-14 NOTE — Telephone Encounter (Signed)
Great!

## 2019-07-14 NOTE — Telephone Encounter (Signed)
FYI - Patient called and wanted to inform doctor that he quit smoking
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
## 2019-07-29 ENCOUNTER — Encounter (HOSPITAL_COMMUNITY): Payer: Self-pay | Admitting: Psychiatry

## 2019-07-29 ENCOUNTER — Other Ambulatory Visit: Payer: Self-pay

## 2019-07-29 ENCOUNTER — Telehealth (INDEPENDENT_AMBULATORY_CARE_PROVIDER_SITE_OTHER): Payer: 59 | Admitting: Psychiatry

## 2019-07-29 DIAGNOSIS — F3162 Bipolar disorder, current episode mixed, moderate: Secondary | ICD-10-CM

## 2019-07-29 MED ORDER — VRAYLAR 4.5 MG PO CAPS: 4.5000 mg | ORAL_CAPSULE | Freq: Every day | ORAL | 2 refills | Status: DC
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
## 2019-08-13 ENCOUNTER — Emergency Department (HOSPITAL_COMMUNITY)
Admission: EM | Admit: 2019-08-13 | Discharge: 2019-08-13 | Disposition: A | Discharge status: Home / Self Care | Payer: 59 | Attending: Emergency Medicine | Admitting: Emergency Medicine

## 2019-08-13 ENCOUNTER — Encounter (HOSPITAL_COMMUNITY): Payer: Self-pay | Admitting: Emergency Medicine

## 2019-08-13 ENCOUNTER — Emergency Department (HOSPITAL_COMMUNITY): Payer: 59

## 2019-08-13 DIAGNOSIS — Z886 Allergy status to analgesic agent status: Secondary | ICD-10-CM | POA: Diagnosis not present

## 2019-08-13 DIAGNOSIS — F17210 Nicotine dependence, cigarettes, uncomplicated: Secondary | ICD-10-CM | POA: Insufficient documentation

## 2019-08-13 DIAGNOSIS — Z79899 Other long term (current) drug therapy: Secondary | ICD-10-CM | POA: Insufficient documentation

## 2019-08-13 DIAGNOSIS — E119 Type 2 diabetes mellitus without complications: Secondary | ICD-10-CM | POA: Insufficient documentation

## 2019-08-13 DIAGNOSIS — R52 Pain, unspecified: Secondary | ICD-10-CM

## 2019-08-13 DIAGNOSIS — E039 Hypothyroidism, unspecified: Secondary | ICD-10-CM | POA: Diagnosis not present

## 2019-08-13 DIAGNOSIS — I10 Essential (primary) hypertension: Secondary | ICD-10-CM | POA: Insufficient documentation

## 2019-08-13 DIAGNOSIS — Z7984 Long term (current) use of oral hypoglycemic drugs: Secondary | ICD-10-CM | POA: Diagnosis not present

## 2019-08-13 DIAGNOSIS — Z881 Allergy status to other antibiotic agents status: Secondary | ICD-10-CM | POA: Diagnosis not present

## 2019-08-13 DIAGNOSIS — M545 Low back pain: Secondary | ICD-10-CM | POA: Diagnosis present

## 2019-08-13 DIAGNOSIS — M479 Spondylosis, unspecified: Secondary | ICD-10-CM | POA: Diagnosis not present

## 2019-08-13 DIAGNOSIS — Z880 Allergy status to penicillin: Secondary | ICD-10-CM | POA: Insufficient documentation

## 2019-08-13 DIAGNOSIS — M47816 Spondylosis without myelopathy or radiculopathy, lumbar region: Secondary | ICD-10-CM

## 2019-08-13 LAB — CBC
HCT: 43.2 % (ref 39.0–52.0)
Hemoglobin: 14.2 g/dL (ref 13.0–17.0)
MCH: 30.3 pg (ref 26.0–34.0)
MCHC: 32.9 g/dL (ref 30.0–36.0)
MCV: 92.1 fL (ref 80.0–100.0)
Platelets: 407 10*3/uL — ABNORMAL HIGH (ref 150–400)
RBC: 4.69 MIL/uL (ref 4.22–5.81)
RDW: 14.7 % (ref 11.5–15.5)
WBC: 10.2 10*3/uL (ref 4.0–10.5)
nRBC: 0 % (ref 0.0–0.2)

## 2019-08-13 LAB — BASIC METABOLIC PANEL
Anion gap: 10 (ref 5–15)
BUN: 11 mg/dL (ref 6–20)
CO2: 26 mmol/L (ref 22–32)
Calcium: 9.7 mg/dL (ref 8.9–10.3)
Chloride: 102 mmol/L (ref 98–111)
Creatinine, Ser: 0.96 mg/dL (ref 0.61–1.24)
GFR calc Af Amer: >60 mL/min (ref >60)
GFR calc non Af Amer: >60 mL/min (ref >60)
Glucose, Bld: 78 mg/dL (ref 70–99)
Potassium: 4.1 mmol/L (ref 3.5–5.1)
Sodium: 138 mmol/L (ref 135–145)

## 2019-08-13 MED ORDER — OXYCODONE-ACETAMINOPHEN 5-325 MG PO TABS
2.0000 | ORAL_TABLET | Freq: Once | ORAL | Status: AC
  Administered 2019-08-13: 2 via ORAL
  Filled 2019-08-13: qty 2

## 2019-08-13 MED ORDER — METHYLPREDNISOLONE 4 MG PO TBPK
ORAL_TABLET | ORAL | 0 refills | Status: DC
Start: 2019-08-13 — End: 2020-10-10

## 2019-08-13 MED ORDER — GADOBUTROL 1 MMOL/ML IV SOLN
10.0000 mL | Freq: Once | INTRAVENOUS | Status: AC | PRN
  Administered 2019-08-13: 10 mL via INTRAVENOUS

## 2019-08-13 MED ORDER — OXYCODONE-ACETAMINOPHEN 5-325 MG PO TABS: 1.0000 | ORAL_TABLET | Freq: Four times a day (QID) | ORAL | 0 refills | Status: DC | PRN

## 2019-08-13 MED ORDER — NAPROXEN 500 MG PO TABS
500.0000 mg | ORAL_TABLET | Freq: Two times a day (BID) | ORAL | 0 refills | Status: DC
Start: 2019-08-13 — End: 2020-10-10

## 2019-08-13 MED ORDER — HYDROMORPHONE HCL 1 MG/ML IJ SOLN
1.0000 mg | Freq: Once | INTRAMUSCULAR | Status: AC
  Administered 2019-08-13: 1 mg via INTRAMUSCULAR
  Filled 2019-08-13: qty 1

## 2019-08-13 MED ORDER — DEXAMETHASONE SODIUM PHOSPHATE 10 MG/ML IJ SOLN
10.0000 mg | Freq: Once | INTRAMUSCULAR | Status: AC
  Administered 2019-08-13: 10 mg via INTRAMUSCULAR
  Filled 2019-08-13: qty 1
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
## 2019-08-13 NOTE — ED Triage Notes (Signed)
Patient was bending over and felt a pop in lower back, currently having lower back pain and right leg numbness.

## 2019-08-13 NOTE — Discharge Instructions (Signed)
You may take the following medications  Naproxen twice a day as an anti-inflammatory for mild pain, Percocet, 1 tablet every 6 hours as needed for severe pain, please take a stool softener with this you do not get constipated.  Medrol Dosepak for the next 6 days.  This is a steroid that will help prevent swelling on the nerve.  If you should develop increasing or worsening pain, numbness, weakness, fever, difficulty urinating or having bowel movement she should return to the emergency department immediately.  Please call the office of Dr. Wynetta Emery upon leaving today to make an appointment to be followed up.  They are aware of your MRI and that you were seen in the emergency department today.

## 2019-08-13 NOTE — ED Notes (Signed)
Pt in bed, pt states that he was bending over and felt a pop and had a sudden onset of back pain, pt states that it hurts to move, denies loss of bowel or bladder function, reports decreased sensation in his R leg.  MD at bedside.

## 2019-08-26 ENCOUNTER — Telehealth (HOSPITAL_COMMUNITY): Payer: 59 | Admitting: Psychiatry

## 2019-09-24 ENCOUNTER — Other Ambulatory Visit: Payer: Self-pay

## 2019-09-24 ENCOUNTER — Telehealth (INDEPENDENT_AMBULATORY_CARE_PROVIDER_SITE_OTHER): Payer: 59 | Admitting: Psychiatry

## 2019-09-24 ENCOUNTER — Encounter (HOSPITAL_COMMUNITY): Payer: Self-pay | Admitting: Psychiatry

## 2019-09-24 DIAGNOSIS — F3162 Bipolar disorder, current episode mixed, moderate: Secondary | ICD-10-CM | POA: Diagnosis not present

## 2019-09-24 MED ORDER — VRAYLAR 4.5 MG PO CAPS: 4.5000 mg | ORAL_CAPSULE | Freq: Every day | ORAL | 2 refills | Status: DC

## 2019-09-24 MED ORDER — ALPRAZOLAM 1 MG PO TABS: 1.0000 mg | ORAL_TABLET | Freq: Three times a day (TID) | ORAL | 2 refills | Status: DC

## 2019-09-24 MED ORDER — FLUOXETINE HCL 20 MG PO CAPS
20.0000 mg | ORAL_CAPSULE | Freq: Every day | ORAL | 2 refills | Status: DC
Start: 2019-09-24 — End: 2019-10-31

## 2019-09-24 MED ORDER — TRAZODONE HCL 100 MG PO TABS: ORAL_TABLET | ORAL | 2 refills | Status: DC

## 2019-09-24 MED ORDER — GABAPENTIN 600 MG PO TABS: 900.0000 mg | ORAL_TABLET | Freq: Every day | ORAL | 2 refills | Status: DC
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
## 2019-09-28 ENCOUNTER — Other Ambulatory Visit: Payer: Self-pay | Admitting: "Endocrinology

## 2019-09-28 ENCOUNTER — Other Ambulatory Visit (HOSPITAL_COMMUNITY): Payer: Self-pay | Admitting: Psychiatry

## 2019-10-31 ENCOUNTER — Telehealth (INDEPENDENT_AMBULATORY_CARE_PROVIDER_SITE_OTHER): Payer: 59 | Admitting: Psychiatry

## 2019-10-31 ENCOUNTER — Other Ambulatory Visit: Payer: Self-pay

## 2019-10-31 ENCOUNTER — Encounter (HOSPITAL_COMMUNITY): Payer: Self-pay | Admitting: Psychiatry

## 2019-10-31 DIAGNOSIS — F3162 Bipolar disorder, current episode mixed, moderate: Secondary | ICD-10-CM | POA: Diagnosis not present

## 2019-10-31 MED ORDER — VRAYLAR 4.5 MG PO CAPS: 4.5000 mg | ORAL_CAPSULE | Freq: Every day | ORAL | 2 refills | Status: DC

## 2019-10-31 MED ORDER — ALPRAZOLAM 1 MG PO TABS: 1.0000 mg | ORAL_TABLET | Freq: Three times a day (TID) | ORAL | 2 refills | Status: DC

## 2019-10-31 MED ORDER — TRAZODONE HCL 100 MG PO TABS: ORAL_TABLET | ORAL | 2 refills | Status: DC

## 2019-10-31 MED ORDER — GABAPENTIN 600 MG PO TABS: 900.0000 mg | ORAL_TABLET | Freq: Every day | ORAL | 2 refills | Status: DC

## 2019-10-31 MED ORDER — HYDROXYZINE HCL 25 MG PO TABS: 25.0000 mg | ORAL_TABLET | Freq: Three times a day (TID) | ORAL | 2 refills | Status: DC | PRN
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
## 2019-12-28 ENCOUNTER — Other Ambulatory Visit (HOSPITAL_COMMUNITY): Payer: Self-pay | Admitting: Psychiatry

## 2020-01-23 ENCOUNTER — Other Ambulatory Visit (HOSPITAL_COMMUNITY): Payer: Self-pay | Admitting: Psychiatry

## 2020-01-27 ENCOUNTER — Telehealth (INDEPENDENT_AMBULATORY_CARE_PROVIDER_SITE_OTHER): Payer: 59 | Admitting: Psychiatry

## 2020-01-27 ENCOUNTER — Other Ambulatory Visit: Payer: Self-pay

## 2020-01-27 ENCOUNTER — Encounter (HOSPITAL_COMMUNITY): Payer: Self-pay | Admitting: Psychiatry

## 2020-01-27 DIAGNOSIS — F902 Attention-deficit hyperactivity disorder, combined type: Secondary | ICD-10-CM | POA: Diagnosis not present

## 2020-01-27 DIAGNOSIS — F3162 Bipolar disorder, current episode mixed, moderate: Secondary | ICD-10-CM | POA: Diagnosis not present

## 2020-01-27 MED ORDER — GABAPENTIN 600 MG PO TABS: 900.0000 mg | ORAL_TABLET | Freq: Every day | ORAL | 2 refills | Status: DC

## 2020-01-27 MED ORDER — TRAZODONE HCL 100 MG PO TABS: ORAL_TABLET | ORAL | 2 refills | Status: DC

## 2020-01-27 MED ORDER — HYDROXYZINE HCL 25 MG PO TABS: 25.0000 mg | ORAL_TABLET | Freq: Three times a day (TID) | ORAL | 2 refills | Status: DC | PRN

## 2020-01-27 MED ORDER — ALPRAZOLAM 1 MG PO TABS: 1.0000 mg | ORAL_TABLET | Freq: Three times a day (TID) | ORAL | 2 refills | Status: DC

## 2020-01-27 MED ORDER — VRAYLAR 4.5 MG PO CAPS: 4.5000 mg | ORAL_CAPSULE | Freq: Every day | ORAL | 2 refills | Status: DC
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
## 2020-01-28 ENCOUNTER — Other Ambulatory Visit (HOSPITAL_COMMUNITY): Payer: Self-pay | Admitting: Psychiatry

## 2020-01-30 ENCOUNTER — Telehealth (HOSPITAL_COMMUNITY): Payer: 59 | Admitting: Psychiatry

## 2020-04-22 ENCOUNTER — Other Ambulatory Visit (HOSPITAL_COMMUNITY): Payer: Self-pay | Admitting: Psychiatry

## 2020-04-28 ENCOUNTER — Encounter (HOSPITAL_COMMUNITY): Payer: Self-pay | Admitting: Psychiatry

## 2020-04-28 ENCOUNTER — Telehealth (INDEPENDENT_AMBULATORY_CARE_PROVIDER_SITE_OTHER): Payer: 59 | Admitting: Psychiatry

## 2020-04-28 ENCOUNTER — Other Ambulatory Visit: Payer: Self-pay

## 2020-04-28 ENCOUNTER — Encounter (HOSPITAL_COMMUNITY): Payer: Self-pay

## 2020-04-28 DIAGNOSIS — F902 Attention-deficit hyperactivity disorder, combined type: Secondary | ICD-10-CM | POA: Diagnosis not present

## 2020-04-28 DIAGNOSIS — F3162 Bipolar disorder, current episode mixed, moderate: Secondary | ICD-10-CM | POA: Diagnosis not present

## 2020-04-28 MED ORDER — VRAYLAR 4.5 MG PO CAPS: 4.5000 mg | ORAL_CAPSULE | Freq: Every day | ORAL | 2 refills | Status: DC

## 2020-04-28 MED ORDER — GABAPENTIN 600 MG PO TABS: 900.0000 mg | ORAL_TABLET | Freq: Every day | ORAL | 2 refills | Status: DC

## 2020-04-28 MED ORDER — ALPRAZOLAM 1 MG PO TABS: 1.0000 mg | ORAL_TABLET | Freq: Three times a day (TID) | ORAL | 2 refills | Status: DC

## 2020-04-28 MED ORDER — HYDROXYZINE HCL 25 MG PO TABS: 25.0000 mg | ORAL_TABLET | Freq: Three times a day (TID) | ORAL | 2 refills | Status: DC | PRN

## 2020-04-28 MED ORDER — TRAZODONE HCL 100 MG PO TABS: ORAL_TABLET | ORAL | 2 refills | Status: DC
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
## 2020-05-03 ENCOUNTER — Other Ambulatory Visit: Payer: Self-pay

## 2020-05-03 ENCOUNTER — Encounter (HOSPITAL_COMMUNITY): Payer: Self-pay | Admitting: *Deleted

## 2020-05-03 ENCOUNTER — Emergency Department (HOSPITAL_COMMUNITY)
Admission: EM | Admit: 2020-05-03 | Discharge: 2020-05-03 | Disposition: A | Discharge status: Home / Self Care | Payer: 59 | Attending: Emergency Medicine | Admitting: Emergency Medicine

## 2020-05-03 ENCOUNTER — Emergency Department (HOSPITAL_COMMUNITY): Payer: 59

## 2020-05-03 DIAGNOSIS — E669 Obesity, unspecified: Secondary | ICD-10-CM | POA: Insufficient documentation

## 2020-05-03 DIAGNOSIS — Z79899 Other long term (current) drug therapy: Secondary | ICD-10-CM | POA: Insufficient documentation

## 2020-05-03 DIAGNOSIS — E039 Hypothyroidism, unspecified: Secondary | ICD-10-CM | POA: Insufficient documentation

## 2020-05-03 DIAGNOSIS — F17210 Nicotine dependence, cigarettes, uncomplicated: Secondary | ICD-10-CM | POA: Insufficient documentation

## 2020-05-03 DIAGNOSIS — E119 Type 2 diabetes mellitus without complications: Secondary | ICD-10-CM | POA: Insufficient documentation

## 2020-05-03 DIAGNOSIS — K529 Noninfective gastroenteritis and colitis, unspecified: Secondary | ICD-10-CM | POA: Diagnosis not present

## 2020-05-03 DIAGNOSIS — I10 Essential (primary) hypertension: Secondary | ICD-10-CM | POA: Insufficient documentation

## 2020-05-03 DIAGNOSIS — Z7984 Long term (current) use of oral hypoglycemic drugs: Secondary | ICD-10-CM | POA: Diagnosis not present

## 2020-05-03 DIAGNOSIS — R1084 Generalized abdominal pain: Secondary | ICD-10-CM

## 2020-05-03 DIAGNOSIS — R109 Unspecified abdominal pain: Secondary | ICD-10-CM | POA: Diagnosis present

## 2020-05-03 LAB — CBC
HCT: 50.4 % (ref 39.0–52.0)
Hemoglobin: 16.1 g/dL (ref 13.0–17.0)
MCH: 29.4 pg (ref 26.0–34.0)
MCHC: 31.9 g/dL (ref 30.0–36.0)
MCV: 92 fL (ref 80.0–100.0)
Platelets: 469 10*3/uL — ABNORMAL HIGH (ref 150–400)
RBC: 5.48 MIL/uL (ref 4.22–5.81)
RDW: 13.3 % (ref 11.5–15.5)
WBC: 12.2 10*3/uL — ABNORMAL HIGH (ref 4.0–10.5)
nRBC: 0 % (ref 0.0–0.2)

## 2020-05-03 LAB — URINALYSIS, ROUTINE W REFLEX MICROSCOPIC
Bilirubin Urine: NEGATIVE
Glucose, UA: NEGATIVE mg/dL
Hgb urine dipstick: NEGATIVE
Ketones, ur: NEGATIVE mg/dL
Leukocytes,Ua: NEGATIVE
Nitrite: NEGATIVE
Protein, ur: NEGATIVE mg/dL
Specific Gravity, Urine: 1.013 (ref 1.005–1.030)
pH: 5 (ref 5.0–8.0)

## 2020-05-03 LAB — COMPREHENSIVE METABOLIC PANEL
ALT: 25 U/L (ref 0–44)
AST: 21 U/L (ref 15–41)
Albumin: 4.6 g/dL (ref 3.5–5.0)
Alkaline Phosphatase: 92 U/L (ref 38–126)
Anion gap: 14 (ref 5–15)
BUN: 6 mg/dL (ref 6–20)
CO2: 27 mmol/L (ref 22–32)
Calcium: 9.9 mg/dL (ref 8.9–10.3)
Chloride: 101 mmol/L (ref 98–111)
Creatinine, Ser: 0.91 mg/dL (ref 0.61–1.24)
GFR, Estimated: >60 mL/min (ref >60)
Glucose, Bld: 82 mg/dL (ref 70–99)
Potassium: 4.1 mmol/L (ref 3.5–5.1)
Sodium: 142 mmol/L (ref 135–145)
Total Bilirubin: 0.4 mg/dL (ref 0.3–1.2)
Total Protein: 7.9 g/dL (ref 6.5–8.1)

## 2020-05-03 LAB — LIPASE, BLOOD: Lipase: 44 U/L (ref 11–51)

## 2020-05-03 MED ORDER — ONDANSETRON 4 MG PO TBDP: 4.0000 mg | ORAL_TABLET | Freq: Three times a day (TID) | ORAL | 0 refills | Status: DC | PRN

## 2020-05-03 MED ORDER — DICYCLOMINE HCL 20 MG PO TABS: 20.0000 mg | ORAL_TABLET | Freq: Two times a day (BID) | ORAL | 0 refills | Status: DC

## 2020-05-03 MED ORDER — SODIUM CHLORIDE 0.9 % IV BOLUS
1000.0000 mL | Freq: Once | INTRAVENOUS | Status: AC
  Administered 2020-05-03: 1000 mL via INTRAVENOUS

## 2020-05-03 MED ORDER — IOHEXOL 300 MG/ML  SOLN
100.0000 mL | Freq: Once | INTRAMUSCULAR | Status: AC | PRN
  Administered 2020-05-03: 100 mL via INTRAVENOUS

## 2020-05-03 MED ORDER — ONDANSETRON HCL 4 MG/2ML IJ SOLN
4.0000 mg | Freq: Once | INTRAMUSCULAR | Status: AC
  Administered 2020-05-03: 4 mg via INTRAVENOUS
  Filled 2020-05-03: qty 2

## 2020-05-03 NOTE — ED Notes (Signed)
ED Provider at bedside. 
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
## 2020-05-03 NOTE — ED Notes (Signed)
Entered room and introduced self to patient. Pt appears to be resting in bed, respirations are even and unlabored with equal chest rise and fall. Bed is locked in the lowest position, side rails x2, call bell within reach. Pt educated on call light use and hourly rounding, verbalized understanding and in agreement at this time. All questions and concerns voiced addressed. Refreshments offered and provided per patient request.  

## 2020-05-03 NOTE — ED Triage Notes (Signed)
Diarrhea for 8 months, states he is scheduled for GI in a week. States he now has abdominal pain

## 2020-05-03 NOTE — Discharge Instructions (Addendum)
Nausea, Vomiting, and Diarrhea  Hand washing: Wash your hands throughout the day, but especially before and after touching the face, using the restroom, sneezing, coughing, or touching surfaces that have been coughed or sneezed upon. Hydration: Symptoms will be intensified and complicated by dehydration. Dehydration can also extend the duration of symptoms. Drink plenty of fluids and get plenty of rest. You should be drinking at least half a liter of water an hour to stay hydrated. Electrolyte drinks (ex. Gatorade, Powerade, Pedialyte) are also encouraged. You should be drinking enough fluids to make your urine light yellow, almost clear. If this is not the case, you are not drinking enough water. Please note that some of the treatments indicated below will not be effective if you are not adequately hydrated. Diet: Please concentrate on hydration, however, you may introduce food slowly.  Start with a clear liquid diet, progressed to a full liquid diet, and then bland solids as you are able. Pain or fever: Ibuprofen, Naproxen, or Tylenol for pain or fever.  Nausea/vomiting: Use the ondansetron (generic for Zofran) for nausea or vomiting.  This medication may not prevent all vomiting or nausea, but can help facilitate better hydration. Things that can help with nausea/vomiting also include peppermint/menthol candies, vitamin B12, and ginger. Diarrhea: May use medications such as loperamide (Imodium) or Bismuth subsalicylate (Pepto-Bismol). Dicyclomine: Dicyclomine (generic for Bentyl) is what is known as an antispasmodic and is intended to help reduce abdominal discomfort. Follow-up: Follow-up with the gastroenterologist on this matter, as planned. Return: Return should you develop a fever, bloody diarrhea, increased abdominal pain, uncontrolled vomiting, or any other major concerns.  For prescription assistance, may try using prescription discount sites or apps, such as goodrx.com

## 2020-05-20 ENCOUNTER — Telehealth (HOSPITAL_COMMUNITY): Payer: Self-pay | Admitting: Psychiatry

## 2020-05-20 NOTE — Telephone Encounter (Signed)
Called to schedule f/u appt, patient advised he did not have time to schedule appt

## 2020-05-25 ENCOUNTER — Other Ambulatory Visit (HOSPITAL_COMMUNITY): Payer: Self-pay | Admitting: Psychiatry

## 2020-07-26 ENCOUNTER — Encounter (HOSPITAL_COMMUNITY): Payer: Self-pay | Admitting: Psychiatry

## 2020-07-26 ENCOUNTER — Other Ambulatory Visit: Payer: Self-pay

## 2020-07-26 ENCOUNTER — Telehealth (INDEPENDENT_AMBULATORY_CARE_PROVIDER_SITE_OTHER): Payer: 59 | Admitting: Psychiatry

## 2020-07-26 DIAGNOSIS — F3162 Bipolar disorder, current episode mixed, moderate: Secondary | ICD-10-CM

## 2020-07-26 DIAGNOSIS — F902 Attention-deficit hyperactivity disorder, combined type: Secondary | ICD-10-CM | POA: Diagnosis not present

## 2020-07-26 MED ORDER — HYDROXYZINE HCL 25 MG PO TABS: 25.0000 mg | ORAL_TABLET | Freq: Three times a day (TID) | ORAL | 2 refills | Status: DC | PRN

## 2020-07-26 MED ORDER — ALPRAZOLAM 1 MG PO TABS: 1.0000 mg | ORAL_TABLET | Freq: Three times a day (TID) | ORAL | 2 refills | Status: DC

## 2020-07-26 MED ORDER — GABAPENTIN 600 MG PO TABS: 900.0000 mg | ORAL_TABLET | Freq: Every day | ORAL | 2 refills | Status: DC

## 2020-07-26 MED ORDER — TRAZODONE HCL 100 MG PO TABS: ORAL_TABLET | ORAL | 2 refills | Status: DC

## 2020-07-26 MED ORDER — VRAYLAR 4.5 MG PO CAPS: 4.5000 mg | ORAL_CAPSULE | Freq: Every day | ORAL | 2 refills | Status: DC
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
## 2020-10-10 ENCOUNTER — Emergency Department (HOSPITAL_COMMUNITY)
Admission: EM | Admit: 2020-10-10 | Discharge: 2020-10-10 | Disposition: A | Discharge status: Home / Self Care | Payer: 59 | Attending: Emergency Medicine | Admitting: Emergency Medicine

## 2020-10-10 ENCOUNTER — Encounter (HOSPITAL_COMMUNITY): Payer: Self-pay

## 2020-10-10 ENCOUNTER — Other Ambulatory Visit: Payer: Self-pay

## 2020-10-10 DIAGNOSIS — M5416 Radiculopathy, lumbar region: Secondary | ICD-10-CM | POA: Insufficient documentation

## 2020-10-10 DIAGNOSIS — E039 Hypothyroidism, unspecified: Secondary | ICD-10-CM | POA: Diagnosis not present

## 2020-10-10 DIAGNOSIS — Z79899 Other long term (current) drug therapy: Secondary | ICD-10-CM | POA: Diagnosis not present

## 2020-10-10 DIAGNOSIS — F17210 Nicotine dependence, cigarettes, uncomplicated: Secondary | ICD-10-CM | POA: Insufficient documentation

## 2020-10-10 DIAGNOSIS — I10 Essential (primary) hypertension: Secondary | ICD-10-CM | POA: Diagnosis not present

## 2020-10-10 DIAGNOSIS — E119 Type 2 diabetes mellitus without complications: Secondary | ICD-10-CM | POA: Diagnosis not present

## 2020-10-10 DIAGNOSIS — M545 Low back pain, unspecified: Secondary | ICD-10-CM | POA: Diagnosis present

## 2020-10-10 DIAGNOSIS — Z7984 Long term (current) use of oral hypoglycemic drugs: Secondary | ICD-10-CM | POA: Insufficient documentation

## 2020-10-10 MED ORDER — HYDROMORPHONE HCL 1 MG/ML IJ SOLN
1.0000 mg | Freq: Once | INTRAMUSCULAR | Status: AC
Start: 2020-10-10 — End: 2020-10-10
  Administered 2020-10-10: 1 mg via INTRAMUSCULAR
  Filled 2020-10-10: qty 1

## 2020-10-10 MED ORDER — OXYCODONE-ACETAMINOPHEN 5-325 MG PO TABS: 1.0000 | ORAL_TABLET | Freq: Four times a day (QID) | ORAL | 0 refills | Status: DC | PRN

## 2020-10-10 MED ORDER — METHOCARBAMOL 500 MG PO TABS: 500.0000 mg | ORAL_TABLET | Freq: Two times a day (BID) | ORAL | 0 refills | Status: DC | PRN

## 2020-10-10 MED ORDER — METHYLPREDNISOLONE 4 MG PO TBPK: ORAL_TABLET | ORAL | 0 refills | Status: DC

## 2020-10-10 MED ORDER — NAPROXEN 500 MG PO TABS: 500.0000 mg | ORAL_TABLET | Freq: Two times a day (BID) | ORAL | 0 refills | Status: DC

## 2020-10-10 MED ORDER — DEXAMETHASONE SODIUM PHOSPHATE 10 MG/ML IJ SOLN
10.0000 mg | Freq: Once | INTRAMUSCULAR | Status: AC
  Administered 2020-10-10: 10 mg via INTRAMUSCULAR
  Filled 2020-10-10: qty 1

## 2020-10-10 MED ORDER — KETOROLAC TROMETHAMINE 60 MG/2ML IM SOLN
60.0000 mg | Freq: Once | INTRAMUSCULAR | Status: AC
  Administered 2020-10-10: 60 mg via INTRAMUSCULAR
  Filled 2020-10-10: qty 2
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
## 2020-10-10 NOTE — ED Triage Notes (Signed)
Pt presents to ED with complaints of lower back pain shooting down left leg. Pt states he can't feel his toes on the left side. Pt states happened after he bent over yesterday.

## 2020-10-10 NOTE — Discharge Instructions (Addendum)
Medrol dose pack for 6 days  Please take Naprosyn, 500mg  by mouth twice daily as needed for pain - this in an antiinflammatory medicine (NSAID) and is similar to ibuprofen - many people feel that it is stronger than ibuprofen and it is easier to take since it is a smaller pill.  Please use this only for 1 week - if your pain persists, you will need to follow up with your doctor in the office for ongoing guidance and pain control.    Please take Robaxin, 500 mg up to twice a day as needed for muscle spasm, this is a muscle relaxer, it may cause generalized weakness, sleepiness and you should not drive or do important things while taking this medication.    Acetaminophen; Oxycodone tablets or PERCOCET  This is a pain reliever. It is used to treat mild to moderate pain.  This medicine may be used for other purposes; ask your health care provider or pharmacist if you have questions.  What should I tell my health care provider before I take this medicine?  They need to know if you have any of these conditions:  -brain tumor  -Crohn's disease, inflammatory bowel disease, or ulcerative colitis  -drink more than 3 alcohol containing drinks per day  -drug abuse or addiction  -head injury  -heart or circulation problems  -kidney disease or problems going to the bathroom  -liver disease  -lung disease, asthma, or breathing problems  -an unusual or allergic reaction to acetaminophen, oxycodone, other opioid analgesics, other medicines, foods, dyes, or preservatives  -pregnant or trying to get pregnant  -breast-feeding  How should I use this medicine?  Take this medicine by mouth with a full glass of water. Follow the directions on the prescription label. Take your medicine at regular intervals. Do not take your medicine more often than directed.  Talk to your pediatrician regarding the use of this medicine in children. Special care may be needed.  Patients over 31 years old may have a stronger  reaction and need a smaller dose.  Overdosage: If you think you have taken too much of this medicine contact a poison control center or emergency room at once.  NOTE: This medicine is only for you. Do not share this medicine with others.   What may interact with this medicine?  -alcohol or medicines that contain alcohol  -antihistamines  -barbiturates like amobarbital, butalbital, butabarbital, methohexital, pentobarbital, phenobarbital, thiopental, and secobarbital  -benztropine  -drugs for bladder problems like solifenacin, trospium, oxybutynin, tolterodine, hyoscyamine, and methscopolamine  -drugs for breathing problems like ipratropium and tiotropium  -drugs for certain stomach or intestine problems like propantheline, homatropine methylbromide, glycopyrrolate, atropine, belladonna, and dicyclomine  -general anesthetics like etomidate, ketamine, nitrous oxide, propofol, desflurane, enflurane, halothane, isoflurane, and sevoflurane  -medicines for depression, anxiety, or psychotic disturbances  -medicines for pain like codeine, morphine, pentazocine, buprenorphine, butorphanol, nalbuphine, tramadol, and propoxyphene  -medicines for sleep  -muscle relaxants  -naltrexone  -phenothiazines like perphenazine, thioridazine, chlorpromazine, mesoridazine, fluphenazine, prochlorperazine, promazine, and trifluoperazine  -scopolamine  -trihexyphenidyl  This list may not describe all possible interactions. Give your health care provider a list of all the medicines, herbs, non-prescription drugs, or dietary supplements you use. Also tell them if you smoke, drink alcohol, or use illegal drugs. Some items may interact with your medicine.  What should I watch for while using this medicine?  Tell your doctor or health care professional if your pain does not go away, if it gets worse, or if  you have new or a different type of pain Do not suddenly stop taking your medicine because you may develop a severe  reaction. Your body becomes used to the medicine. This does NOT mean you are addicted. Addiction is a behavior related to getting and using a drug for a nonmedical reason.  You may get drowsy or dizzy. Do not drive, use machinery, or do anything that needs mental alertness until you know how this medicine affects you. Do not stand or sit up quickly, especially if you are an older patient. This reduces the risk of dizzy or fainting spells. Alcohol may interfere with the effect of this medicine. Avoid alcoholic drinks.  The medicine will cause constipation. Try to have a bowel movement at least every 2 to 3 days. If you do not have a bowel movement for 3 days, call your doctor or health care professional.  Do not take Tylenol (acetaminophen) or medicines that have acetaminophen with this medicine. Too much acetaminophen can be very dangerous. Many nonprescription medicines contain acetaminophen. Always read the labels carefully to avoid taking more acetaminophen.   What side effects may I notice from receiving this medicine?  Side effects that you should report to your doctor or health care professional as soon as possible:  -allergic reactions like skin rash, itching or hives, swelling of the face, lips, or tongue  -breathing difficulties, wheezing  -confusion  -light headedness or fainting spells  -severe stomach pain  -yellowing of the skin or the whites of the eyes  Side effects that usually do not require medical attention (report to your doctor or health care professional if they continue or are bothersome):  -dizziness  -drowsiness  -nausea  -vomiting  This list may not describe all possible side effects. Call your doctor for medical advice about side effects. You may report side effects to FDA at 1-800-FDA-1088.  Where should I keep my medicine?  Keep out of the reach of children. This medicine can be abused. Keep your medicine in a safe place to protect it from theft. Do not share this  medicine with anyone. Selling or giving away this medicine is dangerous and against the law.   NOTE: This sheet is a summary. It may not cover all possible information. If you have questions about this medicine, talk to your doctor, pharmacist, or health care provider.   Thank you for letting us take care of you today!  Please obtain all of your results from medical records or have your doctors office obtain the results - share them with your doctor - you should be seen at your doctors office in the next 2 days. Call today to arrange your follow up. Take the medications as prescribed. Please review all of the medicines and only take them if you do not have an allergy to them. Please be aware that if you are taking birth control pills, taking other prescriptions, ESPECIALLY ANTIBIOTICS may make the birth control ineffective - if this is the case, either do not engage in sexual activity or use alternative methods of birth control such as condoms until you have finished the medicine and your family doctor says it is OK to restart them. If you are on a blood thinner such as COUMADIN, be aware that any other medicine that you take may cause the coumadin to either work too much, or not enough - you should have your coumadin level rechecked in next 7 days if this is the case.  ?  It is  also a possibility that you have an allergic reaction to any of the medicines that you have been prescribed - Everybody reacts differently to medications and while MOST people have no trouble with most medicines, you may have a reaction such as nausea, vomiting, rash, swelling, shortness of breath. If this is the case, please stop taking the medicine immediately and contact your physician.   If you were given a medication in the ED such as percocet, vicodin, or morphine, be aware that these medicines are sedating and may change your ability to take care of yourself adequately for several hours after being given this medicines - you  should not drive or take care of small children if you were given this medicine in the Emergency Department or if you have been prescribed these types of medicines. ?   You should return to the ER IMMEDIATELY if you develop severe or worsening symptoms.

## 2020-10-19 ENCOUNTER — Other Ambulatory Visit: Payer: Self-pay | Admitting: Student

## 2020-10-19 DIAGNOSIS — M5416 Radiculopathy, lumbar region: Secondary | ICD-10-CM

## 2020-10-24 ENCOUNTER — Other Ambulatory Visit: Payer: Self-pay

## 2020-10-24 ENCOUNTER — Ambulatory Visit
Admission: RE | Admit: 2020-10-24 | Discharge: 2020-10-24 | Disposition: A | Discharge status: Home / Self Care | Payer: 59 | Source: Ambulatory Visit | Attending: Student | Admitting: Student

## 2020-10-24 DIAGNOSIS — M5416 Radiculopathy, lumbar region: Secondary | ICD-10-CM

## 2020-10-26 ENCOUNTER — Encounter (HOSPITAL_COMMUNITY): Payer: Self-pay | Admitting: Psychiatry

## 2020-10-26 ENCOUNTER — Telehealth (INDEPENDENT_AMBULATORY_CARE_PROVIDER_SITE_OTHER): Payer: 59 | Admitting: Psychiatry

## 2020-10-26 ENCOUNTER — Other Ambulatory Visit: Payer: Self-pay

## 2020-10-26 DIAGNOSIS — F3162 Bipolar disorder, current episode mixed, moderate: Secondary | ICD-10-CM

## 2020-10-26 MED ORDER — HYDROXYZINE HCL 25 MG PO TABS: 25.0000 mg | ORAL_TABLET | Freq: Three times a day (TID) | ORAL | 2 refills | Status: DC | PRN

## 2020-10-26 MED ORDER — GABAPENTIN 600 MG PO TABS
900.0000 mg | ORAL_TABLET | Freq: Every day | ORAL | 2 refills | Status: DC
Start: 2020-10-26 — End: 2021-01-13

## 2020-10-26 MED ORDER — TRAZODONE HCL 100 MG PO TABS: ORAL_TABLET | ORAL | 2 refills | Status: DC

## 2020-10-26 MED ORDER — VRAYLAR 4.5 MG PO CAPS: 4.5000 mg | ORAL_CAPSULE | Freq: Every day | ORAL | 2 refills | Status: DC

## 2020-10-26 MED ORDER — ALPRAZOLAM 1 MG PO TABS: 1.0000 mg | ORAL_TABLET | Freq: Three times a day (TID) | ORAL | 2 refills | Status: DC
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
## 2020-11-15 ENCOUNTER — Other Ambulatory Visit: Payer: Self-pay | Admitting: Neurosurgery

## 2020-11-19 NOTE — Pre-Procedure Instructions (Signed)
Surgical Instructions   Your procedure is scheduled on Wednesday, September 21st. Report to [facility] Main Entrance "A" at 06:30 A.M., then check in with the Admitting office. Call this number if you have problems the morning of surgery: [phone]   If you have any questions prior to your surgery date call [phone]: Open Monday-Friday 8am-4pm   Remember: Do not eat or drink after midnight the night before your surgery     Take these medicines the morning of surgery with A SIP OF WATER  levothyroxine (SYNTHROID)  If needed: hydrOXYzine (ATARAX/VISTARIL)    WHAT DO I DO ABOUT MY DIABETES MEDICATION?   (9/20) DAY BEFORE SURGERY: Take morning dose of glimepiride (AMARYL).   (9/21) DAY OF SURGERY: DO NOT take glimepiride (AMARYL).     HOW TO MANAGE YOUR DIABETES BEFORE AND AFTER SURGERY  Why is it important to control my blood sugar before and after surgery? Improving blood sugar levels before and after surgery helps healing and can limit problems. A way of improving blood sugar control is eating a healthy diet by:  Eating less sugar and carbohydrates  Increasing activity/exercise  Talking with your doctor about reaching your blood sugar goals High blood sugars (greater than 180 mg/dL) can raise your risk of infections and slow your recovery, so you will need to focus on controlling your diabetes during the weeks before surgery. Make sure that the doctor who takes care of your diabetes knows about your planned surgery including the date and location.  How do I manage my blood sugar before surgery? Check your blood sugar at least 4 times a day, starting 2 days before surgery, to make sure that the level is not too high or low.  Check your blood sugar the morning of your surgery when you wake up and every 2 hours until you get to the Short Stay unit.  If your blood sugar is less than 70 mg/dL, you will need to treat for low blood sugar: Do not take insulin. Treat a  low blood sugar (less than 70 mg/dL) with  cup of clear juice (cranberry or apple), 4 glucose tablets, OR glucose gel. Recheck blood sugar in 15 minutes after treatment (to make sure it is greater than 70 mg/dL). If your blood sugar is not greater than 70 mg/dL on recheck, call [phone] for further instructions. Report your blood sugar to the short stay nurse when you get to Short Stay.  If you are admitted to the hospital after surgery: Your blood sugar will be checked by the staff and you will probably be given insulin after surgery (instead of oral diabetes medicines) to make sure you have good blood sugar levels. The goal for blood sugar control after surgery is 80-180 mg/dL.   As of today, STOP taking any Aspirin (unless otherwise instructed by your surgeon) Aleve, Naproxen, Ibuprofen, Motrin, Advil, Goody's, BC's, all herbal medications, fish oil, and all vitamins.                     Do NOT Smoke (Tobacco/Vaping)  24 hours prior to your procedure If you use a CPAP at night, you may bring your mask for your overnight stay.   Contacts, glasses, dentures or bridgework may not be worn into surgery, please bring cases for these belongings   For patients admitted to the hospital, discharge time will be determined by your treatment team.   Patients discharged the day of surgery will not be allowed to drive home, and  someone needs to stay with them for 24 hours.  NO VISITORS WILL BE ALLOWED IN PRE-OP WHERE PATIENTS GET READY FOR SURGERY.  ONLY 1 SUPPORT PERSON MAY BE PRESENT WHILE YOU ARE IN SURGERY.  IF YOU ARE TO BE ADMITTED, ONCE YOU ARE IN YOUR ROOM YOU WILL BE ALLOWED TWO (2) VISITORS.  Minor children may have two parents present. Special consideration for safety and communication needs will be reviewed on a case by case basis.  Special instructions:    Oral Hygiene is also important to reduce your risk of infection.  Remember - BRUSH YOUR TEETH THE MORNING OF SURGERY WITH YOUR  REGULAR TOOTHPASTE   [place]- Preparing For Surgery  Before surgery, you can play an important role. Because skin is not sterile, your skin needs to be as free of germs as possible. You can reduce the number of germs on your skin by washing with CHG (chlorahexidine gluconate) Soap before surgery.  CHG is an antiseptic cleaner which kills germs and bonds with the skin to continue killing germs even after washing.     Please do not use if you have an allergy to CHG or antibacterial soaps. If your skin becomes reddened/irritated stop using the CHG.  Do not shave (including legs and underarms) for at least 48 hours prior to first CHG shower. It is OK to shave your face.  Please follow these instructions carefully.     Shower the NIGHT BEFORE SURGERY and the MORNING OF SURGERY with CHG Soap.   If you chose to wash your hair, wash your hair first as usual with your normal shampoo. After you shampoo, rinse your hair and body thoroughly to remove the shampoo.  Then [employer] and genitals (private parts) with your normal soap and rinse thoroughly to remove soap.  After that Use CHG Soap as you would any other liquid soap. You can apply CHG directly to the skin and wash gently with a scrungie or a clean washcloth.   Apply the CHG Soap to your body ONLY FROM THE NECK DOWN.  Do not use on open wounds or open sores. Avoid contact with your eyes, ears, mouth and genitals (private parts). Wash Face and genitals (private parts)  with your normal soap.   Wash thoroughly, paying special attention to the area where your surgery will be performed.  Thoroughly rinse your body with warm water from the neck down.  DO NOT shower/wash with your normal soap after using and rinsing off the CHG Soap.  Pat yourself dry with a CLEAN TOWEL.  Wear CLEAN PAJAMAS to bed the night before surgery  Place CLEAN SHEETS on your bed the night before your surgery  DO NOT SLEEP WITH PETS.   Day of Surgery: Do not wear  jewelry  Do not wear lotions, powders, colognes, or deodorant. Men may shave face and neck. Do not bring valuables to the hospital. [facility] is not responsible for any belongings or valuables. Take a shower with CHG soap. Wear Clean/Comfortable clothing the morning of surgery Remember to brush your teeth WITH YOUR REGULAR TOOTHPASTE.   Please read over the following fact sheets that you were given.

## 2020-11-22 ENCOUNTER — Encounter (HOSPITAL_COMMUNITY): Payer: Self-pay

## 2020-11-22 ENCOUNTER — Other Ambulatory Visit: Payer: Self-pay

## 2020-11-22 ENCOUNTER — Encounter (HOSPITAL_COMMUNITY)
Admission: RE | Admit: 2020-11-22 | Discharge: 2020-11-22 | Disposition: A | Discharge status: Home / Self Care | Payer: 59 | Source: Ambulatory Visit | Attending: Neurosurgery | Admitting: Neurosurgery

## 2020-11-22 ENCOUNTER — Telehealth (HOSPITAL_COMMUNITY): Payer: Self-pay | Admitting: Psychiatry

## 2020-11-22 DIAGNOSIS — Z20822 Contact with and (suspected) exposure to covid-19: Secondary | ICD-10-CM | POA: Insufficient documentation

## 2020-11-22 DIAGNOSIS — Z01818 Encounter for other preprocedural examination: Secondary | ICD-10-CM | POA: Insufficient documentation

## 2020-11-22 HISTORY — DX: Personal history of other diseases of the digestive system: Z87.19

## 2020-11-22 HISTORY — DX: Unspecified osteoarthritis, unspecified site: M19.90

## 2020-11-22 HISTORY — DX: Prediabetes: R73.03

## 2020-11-22 HISTORY — DX: Gastro-esophageal reflux disease without esophagitis: K21.9

## 2020-11-22 LAB — GLUCOSE, CAPILLARY: Glucose-Capillary: 80 mg/dL (ref 70–99)

## 2020-11-22 LAB — BASIC METABOLIC PANEL
Anion gap: 11 (ref 5–15)
BUN: 12 mg/dL (ref 6–20)
CO2: 25 mmol/L (ref 22–32)
Calcium: 9.7 mg/dL (ref 8.9–10.3)
Chloride: 104 mmol/L (ref 98–111)
Creatinine, Ser: 0.92 mg/dL (ref 0.61–1.24)
GFR, Estimated: >60 mL/min (ref >60)
Glucose, Bld: 77 mg/dL (ref 70–99)
Potassium: 3.9 mmol/L (ref 3.5–5.1)
Sodium: 140 mmol/L (ref 135–145)

## 2020-11-22 LAB — CBC
HCT: 50.9 % (ref 39.0–52.0)
Hemoglobin: 16.5 g/dL (ref 13.0–17.0)
MCH: 29.4 pg (ref 26.0–34.0)
MCHC: 32.4 g/dL (ref 30.0–36.0)
MCV: 90.7 fL (ref 80.0–100.0)
Platelets: 406 10*3/uL — ABNORMAL HIGH (ref 150–400)
RBC: 5.61 MIL/uL (ref 4.22–5.81)
RDW: 13 % (ref 11.5–15.5)
WBC: 12.4 10*3/uL — ABNORMAL HIGH (ref 4.0–10.5)
nRBC: 0 % (ref 0.0–0.2)

## 2020-11-22 LAB — SARS CORONAVIRUS 2 (TAT 6-24 HRS): SARS Coronavirus 2: NEGATIVE

## 2020-11-22 LAB — TYPE AND SCREEN
ABO/RH(D): A POS
Antibody Screen: NEGATIVE

## 2020-11-22 LAB — HEMOGLOBIN A1C
Hgb A1c MFr Bld: 5.8 % — ABNORMAL HIGH (ref 4.8–5.6)
Mean Plasma Glucose: 119.76 mg/dL

## 2020-11-22 LAB — SURGICAL PCR SCREEN
MRSA, PCR: NEGATIVE
Staphylococcus aureus: NEGATIVE

## 2020-11-22 NOTE — Progress Notes (Signed)
PCP - [facility] Ace) Cardiologist - denies  PPM/ICD - denies   Chest x-ray - n/a EKG - 11/22/20 Stress Test - denies ECHO - denies Cardiac Cath - denies  Sleep Study - denies   "Always 80 per patient" Does not check CBG  As of today, STOP taking any Aspirin (unless otherwise instructed by your surgeon) Aleve, Naproxen, Ibuprofen, Motrin, Advil, Goody's, BC's, all herbal medications, fish oil, and all vitamins.  ERAS Protcol -no   COVID TEST- performed in PAT 11/22/20   Anesthesia review: no  Patient denies shortness of breath, fever, cough and chest pain at PAT appointment   All instructions explained to the patient, with a verbal understanding of the material. Patient agrees to go over the instructions while at home for a better understanding. Patient also instructed to self quarantine after being tested for COVID-19. The opportunity to ask questions was provided.

## 2020-11-22 NOTE — Telephone Encounter (Signed)
Patient advised that he could not make appts currently due to having so many surgeries

## 2020-11-24 ENCOUNTER — Inpatient Hospital Stay (HOSPITAL_COMMUNITY)
Admission: RE | Admit: 2020-11-24 | Discharge: 2020-11-25 | DRG: 455 | Disposition: A | Discharge status: Home / Self Care | Payer: 59 | Attending: Neurosurgery | Admitting: Neurosurgery

## 2020-11-24 ENCOUNTER — Inpatient Hospital Stay (HOSPITAL_COMMUNITY): Payer: 59

## 2020-11-24 ENCOUNTER — Inpatient Hospital Stay (HOSPITAL_COMMUNITY): Payer: 59 | Admitting: Anesthesiology

## 2020-11-24 ENCOUNTER — Encounter (HOSPITAL_COMMUNITY): Payer: Self-pay | Admitting: Neurosurgery

## 2020-11-24 ENCOUNTER — Encounter (HOSPITAL_COMMUNITY)
Admission: RE | Disposition: A | Discharge status: Home / Self Care | Payer: Self-pay | Source: Ambulatory Visit | Attending: Neurosurgery

## 2020-11-24 ENCOUNTER — Other Ambulatory Visit: Payer: Self-pay

## 2020-11-24 DIAGNOSIS — E039 Hypothyroidism, unspecified: Secondary | ICD-10-CM | POA: Diagnosis present

## 2020-11-24 DIAGNOSIS — Z818 Family history of other mental and behavioral disorders: Secondary | ICD-10-CM | POA: Diagnosis not present

## 2020-11-24 DIAGNOSIS — M48061 Spinal stenosis, lumbar region without neurogenic claudication: Secondary | ICD-10-CM | POA: Diagnosis present

## 2020-11-24 DIAGNOSIS — M5416 Radiculopathy, lumbar region: Secondary | ICD-10-CM | POA: Diagnosis present

## 2020-11-24 DIAGNOSIS — Z20822 Contact with and (suspected) exposure to covid-19: Secondary | ICD-10-CM | POA: Diagnosis present

## 2020-11-24 DIAGNOSIS — I10 Essential (primary) hypertension: Secondary | ICD-10-CM | POA: Diagnosis present

## 2020-11-24 DIAGNOSIS — Z419 Encounter for procedure for purposes other than remedying health state, unspecified: Secondary | ICD-10-CM

## 2020-11-24 DIAGNOSIS — F319 Bipolar disorder, unspecified: Secondary | ICD-10-CM | POA: Diagnosis present

## 2020-11-24 DIAGNOSIS — F17210 Nicotine dependence, cigarettes, uncomplicated: Secondary | ICD-10-CM | POA: Diagnosis present

## 2020-11-24 DIAGNOSIS — K219 Gastro-esophageal reflux disease without esophagitis: Secondary | ICD-10-CM | POA: Diagnosis present

## 2020-11-24 DIAGNOSIS — R7303 Prediabetes: Secondary | ICD-10-CM | POA: Diagnosis present

## 2020-11-24 DIAGNOSIS — Z79890 Hormone replacement therapy (postmenopausal): Secondary | ICD-10-CM

## 2020-11-24 DIAGNOSIS — Z7984 Long term (current) use of oral hypoglycemic drugs: Secondary | ICD-10-CM

## 2020-11-24 LAB — GLUCOSE, CAPILLARY
Glucose-Capillary: 122 mg/dL — ABNORMAL HIGH (ref 70–99)
Glucose-Capillary: 122 mg/dL — ABNORMAL HIGH (ref 70–99)
Glucose-Capillary: 167 mg/dL — ABNORMAL HIGH (ref 70–99)
Glucose-Capillary: 157 mg/dL — ABNORMAL HIGH (ref 70–99)

## 2020-11-24 SURGERY — POSTERIOR LUMBAR FUSION 1 WITH HARDWARE REMOVAL
Anesthesia: General | Site: Back

## 2020-11-24 MED ORDER — PHENYLEPHRINE 40 MCG/ML (10ML) SYRINGE FOR IV PUSH (FOR BLOOD PRESSURE SUPPORT)
PREFILLED_SYRINGE | INTRAVENOUS | Status: AC
  Filled 2020-11-24: qty 10

## 2020-11-24 MED ORDER — SODIUM CHLORIDE 0.9% FLUSH: 3.0000 mL | INTRAVENOUS | Status: DC | PRN

## 2020-11-24 MED ORDER — METHOCARBAMOL 500 MG PO TABS: 500.0000 mg | ORAL_TABLET | Freq: Two times a day (BID) | ORAL | Status: DC | PRN

## 2020-11-24 MED ORDER — PHENOL 1.4 % MT LIQD: 1.0000 | OROMUCOSAL | Status: DC | PRN

## 2020-11-24 MED ORDER — PANTOPRAZOLE SODIUM 40 MG IV SOLR: 40.0000 mg | Freq: Every day | INTRAVENOUS | Status: DC

## 2020-11-24 MED ORDER — LIDOCAINE HCL (PF) 2 % IJ SOLN
INTRAMUSCULAR | Status: AC
  Filled 2020-11-24: qty 5

## 2020-11-24 MED ORDER — ONDANSETRON HCL 4 MG/2ML IJ SOLN
4.0000 mg | Freq: Four times a day (QID) | INTRAMUSCULAR | Status: DC | PRN
Start: 2020-11-24 — End: 2020-11-24

## 2020-11-24 MED ORDER — OXYCODONE HCL 5 MG PO TABS
5.0000 mg | ORAL_TABLET | Freq: Once | ORAL | Status: AC | PRN
  Administered 2020-11-24: 5 mg via ORAL

## 2020-11-24 MED ORDER — LIDOCAINE-EPINEPHRINE 1 %-1:100000 IJ SOLN
INTRAMUSCULAR | Status: DC | PRN
  Administered 2020-11-24: 10 mL

## 2020-11-24 MED ORDER — CHLORHEXIDINE GLUCONATE CLOTH 2 % EX PADS: 6.0000 | MEDICATED_PAD | Freq: Once | CUTANEOUS | Status: DC

## 2020-11-24 MED ORDER — GABAPENTIN 600 MG PO TABS: 900.0000 mg | ORAL_TABLET | Freq: Every day | ORAL | Status: DC

## 2020-11-24 MED ORDER — ONDANSETRON HCL 4 MG/2ML IJ SOLN
INTRAMUSCULAR | Status: AC
  Filled 2020-11-24: qty 2

## 2020-11-24 MED ORDER — METFORMIN HCL 500 MG PO TABS: 500.0000 mg | ORAL_TABLET | Freq: Every day | ORAL | Status: DC

## 2020-11-24 MED ORDER — SUGAMMADEX SODIUM 200 MG/2ML IV SOLN
INTRAVENOUS | Status: DC | PRN
  Administered 2020-11-24: 250 mg via INTRAVENOUS

## 2020-11-24 MED ORDER — LEVOTHYROXINE SODIUM 112 MCG PO TABS
112.0000 ug | ORAL_TABLET | Freq: Every day | ORAL | Status: DC
  Administered 2020-11-25: 112 ug via ORAL
  Filled 2020-11-24: qty 1

## 2020-11-24 MED ORDER — ONDANSETRON HCL 4 MG/2ML IJ SOLN: 4.0000 mg | Freq: Four times a day (QID) | INTRAMUSCULAR | Status: DC | PRN

## 2020-11-24 MED ORDER — ATORVASTATIN CALCIUM 40 MG PO TABS: 40.0000 mg | ORAL_TABLET | Freq: Every day | ORAL | Status: DC

## 2020-11-24 MED ORDER — MIDAZOLAM HCL 2 MG/2ML IJ SOLN
INTRAMUSCULAR | Status: AC
  Filled 2020-11-24: qty 2

## 2020-11-24 MED ORDER — PROPOFOL 10 MG/ML IV BOLUS
INTRAVENOUS | Status: DC | PRN
  Administered 2020-11-24: 200 mg via INTRAVENOUS

## 2020-11-24 MED ORDER — VANCOMYCIN HCL IN DEXTROSE 1-5 GM/200ML-% IV SOLN
1000.0000 mg | INTRAVENOUS | Status: AC
  Administered 2020-11-24: 1000 mg via INTRAVENOUS
  Filled 2020-11-24: qty 200

## 2020-11-24 MED ORDER — GLIMEPIRIDE 2 MG PO TABS
2.0000 mg | ORAL_TABLET | Freq: Every day | ORAL | Status: DC
  Administered 2020-11-25: 2 mg via ORAL
  Filled 2020-11-24: qty 1

## 2020-11-24 MED ORDER — ACETAMINOPHEN 10 MG/ML IV SOLN
INTRAVENOUS | Status: AC
  Filled 2020-11-24: qty 100

## 2020-11-24 MED ORDER — BUPIVACAINE LIPOSOME 1.3 % IJ SUSP
INTRAMUSCULAR | Status: AC
  Filled 2020-11-24: qty 20

## 2020-11-24 MED ORDER — MENTHOL 3 MG MT LOZG: 1.0000 | LOZENGE | OROMUCOSAL | Status: DC | PRN

## 2020-11-24 MED ORDER — ACETAMINOPHEN 650 MG RE SUPP: 650.0000 mg | RECTAL | Status: DC | PRN

## 2020-11-24 MED ORDER — VANCOMYCIN HCL 1500 MG/300ML IV SOLN
1500.0000 mg | Freq: Two times a day (BID) | INTRAVENOUS | Status: DC
  Administered 2020-11-24 – 2020-11-25 (×2): 1500 mg via INTRAVENOUS
  Filled 2020-11-24 (×3): qty 300

## 2020-11-24 MED ORDER — PROPOFOL 10 MG/ML IV BOLUS
INTRAVENOUS | Status: AC
  Filled 2020-11-24: qty 40

## 2020-11-24 MED ORDER — THROMBIN 20000 UNITS EX SOLR
CUTANEOUS | Status: DC | PRN
  Administered 2020-11-24: 20 mL via TOPICAL

## 2020-11-24 MED ORDER — LISINOPRIL 2.5 MG PO TABS
2.5000 mg | ORAL_TABLET | Freq: Every day | ORAL | Status: DC
  Filled 2020-11-24 (×2): qty 1

## 2020-11-24 MED ORDER — INSULIN ASPART 100 UNIT/ML IJ SOLN
0.0000 [IU] | Freq: Three times a day (TID) | INTRAMUSCULAR | Status: DC
Start: 2020-11-24 — End: 2020-11-25
  Administered 2020-11-24 – 2020-11-25 (×2): 3 [IU] via SUBCUTANEOUS

## 2020-11-24 MED ORDER — DICYCLOMINE HCL 20 MG PO TABS: 20.0000 mg | ORAL_TABLET | Freq: Two times a day (BID) | ORAL | Status: DC

## 2020-11-24 MED ORDER — ACETAMINOPHEN 10 MG/ML IV SOLN
INTRAVENOUS | Status: DC | PRN
  Administered 2020-11-24: 1000 mg via INTRAVENOUS

## 2020-11-24 MED ORDER — DEXAMETHASONE SODIUM PHOSPHATE 10 MG/ML IJ SOLN
10.0000 mg | Freq: Once | INTRAMUSCULAR | Status: AC
  Administered 2020-11-24: 10 mg via INTRAVENOUS
  Filled 2020-11-24: qty 1

## 2020-11-24 MED ORDER — KETAMINE HCL 50 MG/5ML IJ SOSY
PREFILLED_SYRINGE | INTRAMUSCULAR | Status: AC
  Filled 2020-11-24: qty 5

## 2020-11-24 MED ORDER — FENTANYL CITRATE (PF) 250 MCG/5ML IJ SOLN
INTRAMUSCULAR | Status: DC | PRN
  Administered 2020-11-24: 150 ug via INTRAVENOUS
  Administered 2020-11-24: 50 ug via INTRAVENOUS

## 2020-11-24 MED ORDER — ACETAMINOPHEN 325 MG PO TABS
650.0000 mg | ORAL_TABLET | ORAL | Status: DC | PRN
  Administered 2020-11-25: 650 mg via ORAL
  Filled 2020-11-24: qty 2

## 2020-11-24 MED ORDER — ROCURONIUM BROMIDE 10 MG/ML (PF) SYRINGE
PREFILLED_SYRINGE | INTRAVENOUS | Status: AC
  Filled 2020-11-24: qty 10

## 2020-11-24 MED ORDER — ONDANSETRON 4 MG PO TBDP: 4.0000 mg | ORAL_TABLET | Freq: Three times a day (TID) | ORAL | Status: DC | PRN

## 2020-11-24 MED ORDER — ALPRAZOLAM 0.5 MG PO TABS
1.0000 mg | ORAL_TABLET | Freq: Every day | ORAL | Status: DC
  Filled 2020-11-24: qty 2

## 2020-11-24 MED ORDER — OXYCODONE HCL 5 MG/5ML PO SOLN: 5.0000 mg | Freq: Once | ORAL | Status: AC | PRN

## 2020-11-24 MED ORDER — HYDROMORPHONE HCL 1 MG/ML IJ SOLN
0.5000 mg | INTRAMUSCULAR | Status: DC | PRN
  Administered 2020-11-24: 0.5 mg via INTRAVENOUS
  Filled 2020-11-24: qty 0.5

## 2020-11-24 MED ORDER — KETAMINE HCL 10 MG/ML IJ SOLN
INTRAMUSCULAR | Status: DC | PRN
  Administered 2020-11-24: 30 mg via INTRAVENOUS
  Administered 2020-11-24 (×2): 10 mg via INTRAVENOUS

## 2020-11-24 MED ORDER — SCOPOLAMINE 1 MG/3DAYS TD PT72
MEDICATED_PATCH | TRANSDERMAL | Status: DC | PRN
  Administered 2020-11-24: 1 via TRANSDERMAL

## 2020-11-24 MED ORDER — ALUM & MAG HYDROXIDE-SIMETH 200-200-20 MG/5ML PO SUSP: 30.0000 mL | Freq: Four times a day (QID) | ORAL | Status: DC | PRN

## 2020-11-24 MED ORDER — FENTANYL CITRATE (PF) 100 MCG/2ML IJ SOLN
25.0000 ug | INTRAMUSCULAR | Status: DC | PRN
Start: 2020-11-24 — End: 2020-11-24
  Administered 2020-11-24: 50 ug via INTRAVENOUS
  Administered 2020-11-24: 25 ug via INTRAVENOUS

## 2020-11-24 MED ORDER — CYCLOBENZAPRINE HCL 10 MG PO TABS
10.0000 mg | ORAL_TABLET | Freq: Three times a day (TID) | ORAL | Status: DC | PRN
  Administered 2020-11-24 – 2020-11-25 (×2): 10 mg via ORAL
  Filled 2020-11-24 (×2): qty 1

## 2020-11-24 MED ORDER — ONDANSETRON HCL 4 MG/2ML IJ SOLN
INTRAMUSCULAR | Status: DC | PRN
  Administered 2020-11-24: 4 mg via INTRAVENOUS

## 2020-11-24 MED ORDER — CHLORHEXIDINE GLUCONATE 0.12 % MT SOLN
15.0000 mL | Freq: Once | OROMUCOSAL | Status: AC
  Administered 2020-11-24: 15 mL via OROMUCOSAL
  Filled 2020-11-24: qty 15

## 2020-11-24 MED ORDER — 0.9 % SODIUM CHLORIDE (POUR BTL) OPTIME
TOPICAL | Status: DC | PRN
  Administered 2020-11-24: 1000 mL

## 2020-11-24 MED ORDER — SODIUM CHLORIDE 0.9 % IV SOLN
250.0000 mL | INTRAVENOUS | Status: DC
  Administered 2020-11-24: 250 mL via INTRAVENOUS

## 2020-11-24 MED ORDER — OXYCODONE HCL 5 MG PO TABS
5.0000 mg | ORAL_TABLET | ORAL | Status: DC | PRN
  Administered 2020-11-24 – 2020-11-25 (×5): 10 mg via ORAL
  Filled 2020-11-24 (×5): qty 2

## 2020-11-24 MED ORDER — LIDOCAINE-EPINEPHRINE 1 %-1:100000 IJ SOLN
INTRAMUSCULAR | Status: AC
  Filled 2020-11-24: qty 1

## 2020-11-24 MED ORDER — BUPIVACAINE LIPOSOME 1.3 % IJ SUSP
INTRAMUSCULAR | Status: DC | PRN
  Administered 2020-11-24: 20 mL

## 2020-11-24 MED ORDER — SODIUM CHLORIDE 0.9% FLUSH
3.0000 mL | Freq: Two times a day (BID) | INTRAVENOUS | Status: DC
  Administered 2020-11-24 (×2): 3 mL via INTRAVENOUS

## 2020-11-24 MED ORDER — FENTANYL CITRATE (PF) 250 MCG/5ML IJ SOLN
INTRAMUSCULAR | Status: AC
  Filled 2020-11-24: qty 5

## 2020-11-24 MED ORDER — ORAL CARE MOUTH RINSE: 15.0000 mL | Freq: Once | OROMUCOSAL | Status: AC

## 2020-11-24 MED ORDER — LIDOCAINE 2% (20 MG/ML) 5 ML SYRINGE
INTRAMUSCULAR | Status: DC | PRN
  Administered 2020-11-24: 60 mg via INTRAVENOUS

## 2020-11-24 MED ORDER — HYDROXYZINE HCL 25 MG PO TABS: 25.0000 mg | ORAL_TABLET | Freq: Three times a day (TID) | ORAL | Status: DC | PRN

## 2020-11-24 MED ORDER — MIDAZOLAM HCL 5 MG/5ML IJ SOLN
INTRAMUSCULAR | Status: DC | PRN
  Administered 2020-11-24: 2 mg via INTRAVENOUS

## 2020-11-24 MED ORDER — FENTANYL CITRATE (PF) 100 MCG/2ML IJ SOLN: 25.0000 ug | INTRAMUSCULAR | Status: DC | PRN

## 2020-11-24 MED ORDER — LACTATED RINGERS IV SOLN: INTRAVENOUS | Status: DC

## 2020-11-24 MED ORDER — FENTANYL CITRATE (PF) 100 MCG/2ML IJ SOLN
INTRAMUSCULAR | Status: AC
  Filled 2020-11-24: qty 2

## 2020-11-24 MED ORDER — PANTOPRAZOLE SODIUM 40 MG PO TBEC
40.0000 mg | DELAYED_RELEASE_TABLET | Freq: Every day | ORAL | Status: DC
  Administered 2020-11-24: 40 mg via ORAL
  Filled 2020-11-24: qty 1

## 2020-11-24 MED ORDER — OXYCODONE-ACETAMINOPHEN 5-325 MG PO TABS: 1.0000 | ORAL_TABLET | Freq: Four times a day (QID) | ORAL | Status: DC | PRN

## 2020-11-24 MED ORDER — PHENYLEPHRINE HCL-NACL 20-0.9 MG/250ML-% IV SOLN
INTRAVENOUS | Status: DC | PRN
  Administered 2020-11-24: 25 ug/min via INTRAVENOUS

## 2020-11-24 MED ORDER — ROCURONIUM BROMIDE 10 MG/ML (PF) SYRINGE
PREFILLED_SYRINGE | INTRAVENOUS | Status: DC | PRN
  Administered 2020-11-24: 20 mg via INTRAVENOUS
  Administered 2020-11-24: 70 mg via INTRAVENOUS
  Administered 2020-11-24: 30 mg via INTRAVENOUS

## 2020-11-24 MED ORDER — OXYCODONE HCL 5 MG PO TABS
ORAL_TABLET | ORAL | Status: AC
  Filled 2020-11-24: qty 1

## 2020-11-24 MED ORDER — TRAZODONE HCL 50 MG PO TABS
200.0000 mg | ORAL_TABLET | Freq: Every day | ORAL | Status: DC
  Administered 2020-11-24: 200 mg via ORAL
  Filled 2020-11-24: qty 1

## 2020-11-24 MED ORDER — THROMBIN 20000 UNITS EX SOLR
CUTANEOUS | Status: AC
  Filled 2020-11-24: qty 20000

## 2020-11-24 MED ORDER — PHENYLEPHRINE 40 MCG/ML (10ML) SYRINGE FOR IV PUSH (FOR BLOOD PRESSURE SUPPORT)
PREFILLED_SYRINGE | INTRAVENOUS | Status: DC | PRN
  Administered 2020-11-24 (×3): 80 ug via INTRAVENOUS

## 2020-11-24 MED ORDER — ONDANSETRON HCL 4 MG PO TABS: 4.0000 mg | ORAL_TABLET | Freq: Four times a day (QID) | ORAL | Status: DC | PRN

## 2020-11-24 MED ORDER — NICOTINE 21 MG/24HR TD PT24
21.0000 mg | MEDICATED_PATCH | Freq: Every day | TRANSDERMAL | Status: DC
  Administered 2020-11-24: 21 mg via TRANSDERMAL
  Filled 2020-11-24: qty 1

## 2020-11-24 MED ORDER — CARIPRAZINE HCL 4.5 MG PO CAPS: 4.5000 mg | ORAL_CAPSULE | Freq: Every day | ORAL | Status: DC

## 2020-11-24 SURGICAL SUPPLY — 81 items
ADH SKN CLS APL DERMABOND .7 (GAUZE/BANDAGES/DRESSINGS) ×1
ADH SKN CLS LQ APL DERMABOND (GAUZE/BANDAGES/DRESSINGS) ×1
APL SKNCLS STERI-STRIP NONHPOA (GAUZE/BANDAGES/DRESSINGS) ×1
BAG COUNTER SPONGE SURGICOUNT (BAG) ×3 IMPLANT
BAG SPNG CNTER NS LX DISP (BAG) ×2
BASKET BONE COLLECTION (BASKET) ×2 IMPLANT
BENZOIN TINCTURE PRP APPL 2/3 (GAUZE/BANDAGES/DRESSINGS) ×2 IMPLANT
BLADE CLIPPER SURG (BLADE) ×1 IMPLANT
BLADE SURG 11 STRL SS (BLADE) ×2 IMPLANT
BONE VIVIGEN FORMABLE 5.4CC (Bone Implant) ×2 IMPLANT
BUR CUTTER 7.0 ROUND (BURR) ×2 IMPLANT
BUR MATCHSTICK NEURO 3.0 LAGG (BURR) ×2 IMPLANT
CANISTER SUCT 3000ML PPV (MISCELLANEOUS) ×2 IMPLANT
CAP LCK SPNE (Orthopedic Implant) ×6 IMPLANT
CAP LOCK SPINE RADIUS (Orthopedic Implant) IMPLANT
CAP LOCKING (Orthopedic Implant) ×12 IMPLANT
CARTRIDGE OIL MAESTRO DRILL (MISCELLANEOUS) ×1 IMPLANT
CLSR STERI-STRIP ANTIMIC 1/2X4 (GAUZE/BANDAGES/DRESSINGS) ×1 IMPLANT
CNTNR URN SCR LID CUP LEK RST (MISCELLANEOUS) ×1 IMPLANT
CONT SPEC 4OZ STRL OR WHT (MISCELLANEOUS) ×2
COVER BACK TABLE 60X90IN (DRAPES) ×2 IMPLANT
DECANTER SPIKE VIAL GLASS SM (MISCELLANEOUS) ×1 IMPLANT
DERMABOND ADHESIVE PROPEN (GAUZE/BANDAGES/DRESSINGS) ×1
DERMABOND ADVANCED (GAUZE/BANDAGES/DRESSINGS) ×1
DERMABOND ADVANCED .7 DNX12 (GAUZE/BANDAGES/DRESSINGS) ×1 IMPLANT
DERMABOND ADVANCED .7 DNX6 (GAUZE/BANDAGES/DRESSINGS) IMPLANT
DIFFUSER DRILL AIR PNEUMATIC (MISCELLANEOUS) ×2 IMPLANT
DRAPE C-ARM 42X72 X-RAY (DRAPES) ×4 IMPLANT
DRAPE C-ARMOR (DRAPES) IMPLANT
DRAPE HALF SHEET 40X57 (DRAPES) IMPLANT
DRAPE LAPAROTOMY 100X72X124 (DRAPES) ×2 IMPLANT
DRAPE SURG 17X23 STRL (DRAPES) ×2 IMPLANT
DRSG OPSITE 4X5.5 SM (GAUZE/BANDAGES/DRESSINGS) ×2 IMPLANT
DRSG OPSITE POSTOP 4X6 (GAUZE/BANDAGES/DRESSINGS) ×2 IMPLANT
DRSG OPSITE POSTOP 4X8 (GAUZE/BANDAGES/DRESSINGS) ×1 IMPLANT
DURAPREP 26ML APPLICATOR (WOUND CARE) ×2 IMPLANT
ELECT REM PT RETURN 9FT ADLT (ELECTROSURGICAL) ×2
ELECTRODE REM PT RTRN 9FT ADLT (ELECTROSURGICAL) ×1 IMPLANT
EVACUATOR 3/16  PVC DRAIN (DRAIN) ×4
EVACUATOR 3/16 PVC DRAIN (DRAIN) ×1 IMPLANT
GAUZE 4X4 16PLY ~~LOC~~+RFID DBL (SPONGE) ×1 IMPLANT
GAUZE SPONGE 4X4 12PLY STRL (GAUZE/BANDAGES/DRESSINGS) ×1 IMPLANT
GLOVE EXAM NITRILE XL STR (GLOVE) IMPLANT
GLOVE SURG ENC MOIS LTX SZ7 (GLOVE) IMPLANT
GLOVE SURG ENC MOIS LTX SZ8 (GLOVE) ×4 IMPLANT
GLOVE SURG UNDER LTX SZ8.5 (GLOVE) ×4 IMPLANT
GLOVE SURG UNDER POLY LF SZ7 (GLOVE) IMPLANT
GOWN STRL REUS W/ TWL LRG LVL3 (GOWN DISPOSABLE) IMPLANT
GOWN STRL REUS W/ TWL XL LVL3 (GOWN DISPOSABLE) ×2 IMPLANT
GOWN STRL REUS W/TWL 2XL LVL3 (GOWN DISPOSABLE) IMPLANT
GOWN STRL REUS W/TWL LRG LVL3 (GOWN DISPOSABLE)
GOWN STRL REUS W/TWL XL LVL3 (GOWN DISPOSABLE) ×4
GRAFT BNE MATRIX VG FRMBL MD 5 (Bone Implant) IMPLANT
GRAFT BONE PROTEIOS LRG 5CC (Orthopedic Implant) ×1 IMPLANT
KIT BASIN OR (CUSTOM PROCEDURE TRAY) ×2 IMPLANT
KIT TURNOVER KIT B (KITS) ×2 IMPLANT
MILL MEDIUM DISP (BLADE) ×2 IMPLANT
NDL HYPO 21X1.5 SAFETY (NEEDLE) ×1 IMPLANT
NDL HYPO 25X1 1.5 SAFETY (NEEDLE) ×1 IMPLANT
NEEDLE HYPO 21X1.5 SAFETY (NEEDLE) ×2 IMPLANT
NEEDLE HYPO 25X1 1.5 SAFETY (NEEDLE) ×2 IMPLANT
NS IRRIG 1000ML POUR BTL (IV SOLUTION) ×3 IMPLANT
OIL CARTRIDGE MAESTRO DRILL (MISCELLANEOUS) ×2
PACK LAMINECTOMY NEURO (CUSTOM PROCEDURE TRAY) ×2 IMPLANT
PAD ARMBOARD 7.5X6 YLW CONV (MISCELLANEOUS) ×6 IMPLANT
ROD 80MM (Rod) ×4 IMPLANT
ROD SPNL 80X5.5 NS TI RDS (Rod) IMPLANT
SCREW 5.75X45MM (Screw) ×2 IMPLANT
SPACER SUSTAIN RT 12 8D 9X26 (Spacer) ×2 IMPLANT
SPONGE SURGIFOAM ABS GEL 100 (HEMOSTASIS) ×2 IMPLANT
SPONGE T-LAP 4X18 ~~LOC~~+RFID (SPONGE) ×1 IMPLANT
STRIP CLOSURE SKIN 1/2X4 (GAUZE/BANDAGES/DRESSINGS) ×4 IMPLANT
SUT VIC AB 0 CT1 18XCR BRD8 (SUTURE) ×2 IMPLANT
SUT VIC AB 0 CT1 8-18 (SUTURE) ×4
SUT VIC AB 2-0 CT1 18 (SUTURE) ×3 IMPLANT
SUT VIC AB 4-0 PS2 27 (SUTURE) ×2 IMPLANT
SYR 20ML LL LF (SYRINGE) IMPLANT
TOWEL GREEN STERILE (TOWEL DISPOSABLE) ×2 IMPLANT
TOWEL GREEN STERILE FF (TOWEL DISPOSABLE) ×2 IMPLANT
TRAY FOLEY MTR SLVR 16FR STAT (SET/KITS/TRAYS/PACK) ×2 IMPLANT
WATER STERILE IRR 1000ML POUR (IV SOLUTION) ×2 IMPLANT

## 2020-11-24 NOTE — Anesthesia Procedure Notes (Signed)
Procedure Name: Intubation Date/Time: 11/24/2020 8:42 AM Performed by: Lovie Chol, CRNA Pre-anesthesia Checklist: Patient identified, Emergency Drugs available, Suction available and Patient being monitored Patient Re-evaluated:Patient Re-evaluated prior to induction Oxygen Delivery Method: Circle System Utilized Preoxygenation: Pre-oxygenation with 100% oxygen Induction Type: IV induction Ventilation: Mask ventilation without difficulty Laryngoscope Size: Miller and 3 Grade View: Grade II Tube type: Oral Tube size: 8.0 mm Number of attempts: 1 Airway Equipment and Method: Stylet and Oral airway Placement Confirmation: ETT inserted through vocal cords under direct vision, positive ETCO2 and breath sounds checked- equal and bilateral Secured at: 23 cm Tube secured with: Tape Dental Injury: Teeth and Oropharynx as per pre-operative assessment

## 2020-11-24 NOTE — Progress Notes (Signed)
Pharmacy Antibiotic Note  Glen Jacobson is a 45 y.o. male admitted on 11/24/2020 with surgical prophylaxis.  Pharmacy has been consulted for Vanco dosing.  ID: s/p spinal surgery with hemovac drain. - Afebrile. WBC 12.4. Scr <1  Plan: Vanco 1g IV x 1 pre-op (should have been 1500mg  for weight >100kg) Vancomycin 1500 mg IV Q 12 hrs. Goal AUC 400-550. Expected AUC: 526 SCr used: 0.96 Con't until discharge and/or drain removal.   Height: 5\' 11"  (180.3 cm) Weight: 122.5 kg (270 lb) IBW/kg (Calculated) : 75.3  Temp (24hrs), Avg:97.9 F (36.6 C), Min:97.4 F (36.3 C), Max:98.7 F (37.1 C)  Recent Labs  Lab 11/22/20 1053  WBC 12.4*  CREATININE 0.92    Estimated Creatinine Clearance: 133.7 mL/min (by C-G formula based on SCr of 0.92 mg/dL).    Allergies  Allergen Reactions   Ciprofloxacin Other (See Comments)    "makes me deathly sick"    Penicillins Other (See Comments)    UNSPECIFIED REACTION OF CHILDHOOD Has patient had a PCN reaction causing immediate rash, facial/tongue/throat swelling, SOB or lightheadedness with hypotension: Unknown Has patient had a PCN reaction causing severe rash involving mucus membranes or skin necrosis: Unknown Has patient had a PCN reaction that required hospitalization: Unknown Has patient had a PCN reaction occurring within the last 10 years: No If all of the above answers are "NO", then may proceed with Cephalosporin use.    Trintellix [Vortioxetine]     Unsure of allergy   Morphine And Related Other (See Comments)    Hallucinations    [name] , PharmD, BCPS Clinical Staff Pharmacist [email or web address]  11/24/20 11/24/2020 1:39 PM

## 2020-11-24 NOTE — Anesthesia Preprocedure Evaluation (Addendum)
Anesthesia Evaluation  Patient identified by MRN, date of birth, ID band Patient awake    Reviewed: Allergy & Precautions, H&P , NPO status , Patient's Chart, lab work & pertinent test results  History of Anesthesia Complications (+) PONV and history of anesthetic complications  Airway Mallampati: III  TM Distance: >3 FB Neck ROM: full    Dental  (+) Dental Advisory Given, Teeth Intact   Pulmonary Current Smoker and Patient abstained from smoking.,    breath sounds clear to auscultation       Cardiovascular hypertension,  Rhythm:regular Rate:Normal     Neuro/Psych  Headaches, PSYCHIATRIC DISORDERS Anxiety Bipolar Disorder    GI/Hepatic hiatal hernia, GERD  ,  Endo/Other  Hypothyroidism   Renal/GU      Musculoskeletal  (+) Arthritis ,   Abdominal   Peds  Hematology   Anesthesia Other Findings   Reproductive/Obstetrics                            Anesthesia Physical Anesthesia Plan  ASA: 2  Anesthesia Plan: General   Post-op Pain Management:    Induction: Intravenous  PONV Risk Score and Plan: 2 and Ondansetron, Dexamethasone, Midazolam and Treatment may vary due to age or medical condition  Airway Management Planned: Oral ETT  Additional Equipment:   Intra-op Plan:   Post-operative Plan: Extubation in OR  Informed Consent: I have reviewed the patients History and Physical, chart, labs and discussed the procedure including the risks, benefits and alternatives for the proposed anesthesia with the patient or authorized representative who has indicated his/her understanding and acceptance.     Dental advisory given  Plan Discussed with: CRNA, Anesthesiologist and Surgeon  Anesthesia Plan Comments:         Anesthesia Quick Evaluation
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
## 2020-11-24 NOTE — Transfer of Care (Signed)
Immediate Anesthesia Transfer of Care Note  Patient: Glen Jacobson  Procedure(s) Performed: Posterior Lumbar Interbody Fusion - Lumbar Three-Lumbar Four exploration fusion remove of hardware (Back)  Patient Location: PACU  Anesthesia Type:General  Level of Consciousness: oriented, sedated and patient cooperative  Airway & Oxygen Therapy: Patient Spontanous Breathing and Patient connected to face mask oxygen  Post-op Assessment: Report given to RN and Post -op Vital signs reviewed and stable  Post vital signs: Reviewed  Last Vitals:  Vitals Value Taken Time  BP 118/75 11/24/20 1152  Temp 36.7 C 11/24/20 1152  Pulse 73 11/24/20 1156  Resp 21 11/24/20 1156  SpO2 99 % 11/24/20 1156  Vitals shown include unvalidated device data.  Last Pain:  Vitals:   11/24/20 0657  TempSrc:   PainSc: 7       Patients Stated Pain Goal: 3 (11/24/20 0657)  Complications: No notable events documented.

## 2020-11-24 NOTE — Anesthesia Postprocedure Evaluation (Signed)
Anesthesia Post Note  Patient: Glen Jacobson  Procedure(s) Performed: Posterior Lumbar Interbody Fusion - Lumbar Three-Lumbar Four exploration fusion remove of hardware (Back)     Patient location during evaluation: PACU Anesthesia Type: General Level of consciousness: awake and alert Pain management: pain level controlled Vital Signs Assessment: post-procedure vital signs reviewed and stable Respiratory status: spontaneous breathing, nonlabored ventilation, respiratory function stable and patient connected to nasal cannula oxygen Cardiovascular status: blood pressure returned to baseline and stable Postop Assessment: no apparent nausea or vomiting Anesthetic complications: no   No notable events documented.  Last Vitals:  Vitals:   11/24/20 1309 11/24/20 1334  BP:  135/81  Pulse:  81  Resp:  16  Temp: (!) 36.3 C 36.6 C  SpO2:  96%    Last Pain:  Vitals:   11/24/20 1452  TempSrc:   PainSc: 4                  [name]

## 2020-11-24 NOTE — Progress Notes (Signed)
Orthopedic Tech Progress Note Patient Details:  Glen Jacobson 14-Feb-1976 [id number]  RN stated patient has BRACE   Patient ID: Glen Jacobson, male   DOB: Feb 06, 1976, 45 y.o.   MRN: [id number]  Glen Jacobson 11/24/2020, 2:06 PM

## 2020-11-24 NOTE — Op Note (Signed)
Preoperative diagnosis: Lumbar spinal stenosis and instability L3-4 with bilateral L3-L4 radiculopathies  Postoperative diagnosis: Same  Procedure: #1 exploration fusion move of hardware L4-5 with disconnecting removal of top loading knots and rods with retention of previously placed L4 and L5 pedicle screws  2.  Decompressive laminectomy with complete medial facetectomy and radical foraminotomies of the L3 and L4 nerve roots at L3-4 in excess and requiring more work than would be needed with a standard interbody fusion  3.  Posterior lumbar interbody fusion utilizing the globus titanium insert and rotate cages packed with locally harvested autograft mixed with vivigen periosteal  4.  Pedicle screw fixation L3-L5 with placement of bilateral Stryker radius pedicle screws at L3 tying into his old construct at L4-5  5.  Posterior lateral arthrodesis L3-4 utilizing the locally harvested autograft mixed  Surgeon: Jillyn Hidden Kaleo Condrey  Assistant: Ervin Knack  Anesthesia: General  EBL: Minimal  HPI: 45 year old gentleman previously undergone L4-5 fusion presents with progressive worsening back and bilateral hip and leg pain rating down L3 and L4 nerve root pattern.  Work-up revealed progressive and segmental degeneration above his previous fusion at L3-4 due to his progression of clinical syndrome imaging findings and failed conservative treatment I recommended reexploration fusion move of hardware and decompressive laminectomy and interbody fusion at L3-4.  I extensively went over the risks and benefits of the operation with him as well as perioperative course expectations of outcome and alternatives of surgery and he understood and agreed to proceed forward.  Operative procedure: Patient was brought into the OR was [facility]on general anesthesia positioned prone the Wilson frame his back was prepped and draped in routine sterile fashion his old incision was opened up and subperiosteal dissection was carried  lamina of L2-L3 and expose the construct at L4-5.  His old fusion did appear to be solid I disconnected the knots and remove the rods.  I then performed a complete decompressive laminectomy with removal of spinous process and the lamina at L3 complete medial facetectomies and aggressively under bit the supra reticulating facet to perform radical foraminotomies of the L3 and L4 nerve roots bilaterally.  Epidural veins coagulated disc base was incised and cleaned out bilaterally removing extensive mount of disc material from the central compartment that it herniated and was causing ventral compression.  Prepared the endplates and utilizing sequential distraction with an 11 distractor in place, I inserted a 9 to titanium cage packed with locally harvested autograft mixed packed and extensive mount of autograft centrally and inserted the contralateral cage under fluoroscopy.  I then placed bilateral L3 pedicle screws under fluoroscopy all screws had excellent purchase postop imaging showed good position of all the implants and hardware.  Then inspected all the foramina to confirm patency and no migration of graft material I aggressively decorticated the TPs lateral facet joints at L3-4 and packed an extensive mount of autograft mix posterior laterally and intertransverse Lee.  Then connected up the rods anchored everything in place placed Gelfoam in the dura placed a medium large Hemovac drain injected Exparel in the fascia.  I then closed the wound in layers with active with interrupted Vicryl and a running 4 subcuticular Dermabond benzoin Steri-Strips and a sterile dressing was applied patient recovery in stable condition.  At the end the case all needle counts and sponge counts were correct.

## 2020-11-25 LAB — GLUCOSE, CAPILLARY: Glucose-Capillary: 173 mg/dL — ABNORMAL HIGH (ref 70–99)

## 2020-11-25 MED ORDER — METHOCARBAMOL 500 MG PO TABS: 500.0000 mg | ORAL_TABLET | Freq: Four times a day (QID) | ORAL | 0 refills | Status: DC

## 2020-11-25 MED ORDER — OXYCODONE-ACETAMINOPHEN 5-325 MG PO TABS: 1.0000 | ORAL_TABLET | ORAL | 0 refills | Status: DC | PRN

## 2020-11-25 NOTE — Evaluation (Signed)
Physical Therapy Evaluation and DISCHARGE Patient Details Name: Glen Jacobson MRN: [id number] DOB: Jul 11, 1975 Today's Date: 11/25/2020  History of Present Illness  Pt is a 45 yo male with back pain radiating down LEs L >R. Pt underwent PLIF L3-4. PMH: ADHD, Bipolar Do, PSH: L wrist surgery - s/p ORIF L ulna - 2022.   Clinical Impression  Pt admitted with above. Pt mobilizing at supervision level without AD. Pt with good understanding of back precautions in conjunction with L UE NWB through hand from recent fracture. Instructed pt he didn't need a walker.   Pt with no further acute PT needs at this time. PT SIGNING OFF. PLease re-consult if needed in future.     Recommendations for follow up therapy are one component of a multi-disciplinary discharge planning process, led by the attending physician.  Recommendations may be updated based on patient status, additional functional criteria and insurance authorization.  Follow Up Recommendations No PT follow up    Equipment Recommendations  None recommended by PT    Recommendations for Other Services       Precautions / Restrictions Precautions Precautions: Back Precaution Booklet Issued: Yes (comment) Precaution Comments: pt able to recall all precautions from previous surgery without looking at hand out Restrictions Weight Bearing Restrictions: No      Mobility  Bed Mobility Overal bed mobility: Needs Assistance Bed Mobility: Rolling;Sidelying to Sit;Sit to Sidelying Rolling: Modified independent (Device/Increase time) Sidelying to sit: Modified independent (Device/Increase time)     Sit to sidelying: Modified independent (Device/Increase time) General bed mobility comments: verbal cues for log rolling and to roll to the L so he can push up on L elbow and not have to use L hand due to his restrictions    Transfers Overall transfer level: Needs assistance Equipment used: None Transfers: Sit to/from Stand Sit to Stand:  Modified independent (Device/Increase time)         General transfer comment: pt with good use of legs, increased time but steady  Ambulation/Gait Ambulation/Gait assistance: Supervision Gait Distance (Feet): 200 Feet Assistive device: None Gait Pattern/deviations: WFL(Within Functional Limits) Gait velocity: wfl Gait velocity interpretation: 1.31 - 2.62 ft/sec, indicative of limited community ambulator General Gait Details: mildly guarded but good gait pattern  Stairs            Wheelchair Mobility    Modified Rankin (Stroke Patients Only)       Balance Overall balance assessment: Mild deficits observed, not formally tested                                           Pertinent Vitals/Pain Pain Assessment: 0-10 Pain Score: 9  Pain Location: back Pain Descriptors / Indicators: Discomfort Pain Intervention(s): Patient requesting pain meds-RN notified    Home Living Family/patient expects to be discharged to:: Private residence Living Arrangements: Spouse/significant other Available Help at Discharge: Family;Available 24 hours/day Type of Home: House Home Access: Ramped entrance     Home Layout: One level Home Equipment: Walker - 2 wheels;Cane - single point      Prior Function Level of Independence: Independent               Hand Dominance   Dominant Hand: Right    Extremity/Trunk Assessment   Upper Extremity Assessment Upper Extremity Assessment: LUE deficits/detail LUE Deficits / Details: pt with recent ulna break, pt with  4th  and 5th fingers in flexion contracture and hand on less than 5 pounds weight restriction    Lower Extremity Assessment Lower Extremity Assessment: Overall WFL for tasks assessed    Cervical / Trunk Assessment Cervical / Trunk Assessment: Other exceptions Cervical / Trunk Exceptions: recent back surgery  Communication   Communication: No difficulties  Cognition Arousal/Alertness:  Awake/alert Behavior During Therapy: WFL for tasks assessed/performed Overall Cognitive Status: Within Functional Limits for tasks assessed                                        General Comments General comments (skin integrity, edema, etc.): drain still intact    Exercises     Assessment/Plan    PT Assessment Patent does not need any further PT services  PT Problem List         PT Treatment Interventions      PT Goals (Current goals can be found in the Care Plan section)  Acute Rehab PT Goals Patient Stated Goal: stop the pain PT Goal Formulation: All assessment and education complete, DC therapy    Frequency     Barriers to discharge        Co-evaluation               AM-PAC PT "6 Clicks" Mobility  Outcome Measure Help needed turning from your back to your side while in a flat bed without using bedrails?: None Help needed moving from lying on your back to sitting on the side of a flat bed without using bedrails?: None Help needed moving to and from a bed to a chair (including a wheelchair)?: None Help needed standing up from a chair using your arms (e.g., wheelchair or bedside chair)?: None Help needed to walk in hospital room?: None Help needed climbing 3-5 steps with a railing? : A Little 6 Click Score: 23    End of Session Equipment Utilized During Treatment: Gait belt;Back brace Activity Tolerance: Patient tolerated treatment well Patient left: in bed;with call bell/phone within reach (siting EOB) Nurse Communication: Mobility status;Patient requests pain meds PT Visit Diagnosis: Difficulty in walking, not elsewhere classified (R26.2)    Time: 4166-0630 PT Time Calculation (min) (ACUTE ONLY): 18 min   Charges:   PT Evaluation $PT Eval Low Complexity: 1 Low          Lewis Shock, PT, DPT Acute Rehabilitation Services Pager #: [phone] Office #: [phone]   Iona Hansen 11/25/2020, 9:37 AM

## 2020-11-25 NOTE — Discharge Summary (Signed)
Physician Discharge Summary  Patient ID: Glen Jacobson MRN: [id number] DOB/AGE: Nov 28, 1975 45 y.o.  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 The patient tolerated the procedure well and was taken to the recovery room and then to thefloor in stable condition. The hospital course was routine. There were no complications. The wound remained clean dry and intact. Pt had appropriate back soreness. No complaints of leg pain or new N/T/W. The patient remained afebrile with stable vital signs, and tolerated a regular diet. The patient continued to increase activities, and pain was well controlled with oral pain medications.   Consults: None  Significant Diagnostic Studies:  Results for orders placed or performed during the hospital encounter of 11/24/20  Glucose, capillary  Result Value Ref Range   Glucose-Capillary 122 (H) 70 - 99 mg/dL   Comment 1 Notify RN   Glucose, capillary  Result Value Ref Range   Glucose-Capillary 122 (H) 70 - 99 mg/dL   Comment 1 Notify RN    Comment 2 Document in Chart   Glucose, capillary  Result Value Ref Range   Glucose-Capillary 167 (H) 70 - 99 mg/dL  Glucose, capillary  Result Value Ref Range   Glucose-Capillary 157 (H) 70 - 99 mg/dL   Comment 1 Notify RN    Comment 2 Document in Chart   Glucose, capillary  Result Value Ref Range   Glucose-Capillary 173 (H) 70 - 99 mg/dL   Comment 1 Notify RN    Comment 2 Document in Chart     DG Lumbar Spine 2-3 Views  Result Date: 11/24/2020 CLINICAL DATA:  L3-4 PLIF EXAM: LUMBAR SPINE - 2-3 VIEW COMPARISON:  10/24/2020 FINDINGS: Two C-arm images show discectomy and fusion in progress at L3-4. Interbody spacers are  in place. Bilateral pedicle screws are in place. Posterior rods not yet inserted. IMPRESSION: PLIF in progress at L3-4. Electronically Signed   By: Paulina Fusi M.D.   On: 11/24/2020 13:57   DG C-Arm 1-60 Min-No Report  Result Date: 11/24/2020 Fluoroscopy was utilized by the requesting physician.  No radiographic interpretation.    Antibiotics:  Anti-infectives (From admission, onward)    Start     Dose/Rate Route Frequency Ordered Stop   11/24/20 1800  vancomycin (VANCOREADY) IVPB 1500 mg/300 mL        1,500 mg 150 mL/hr over 120 Minutes Intravenous Every 12 hours 11/24/20 1339     11/24/20 0645  vancomycin (VANCOCIN) IVPB 1000 mg/200 mL premix        1,000 mg 200 mL/hr over 60 Minutes Intravenous On call to O.R. 11/24/20 8119 11/24/20 0834       Discharge Exam: Blood pressure 101/61, pulse (!) 50, temperature 97.6 F (36.4 C), temperature source Oral, resp. rate 16, height 5\' 11"  (1.803 m), weight 122.5 kg, SpO2 94 %. Neurologic: Grossly normal Ambualtingadn voinding well  Discharge Medications:   Allergies as of 11/25/2020       Reactions   Ciprofloxacin Other (See Comments)   "makes me deathly sick"    Penicillins Other (See Comments)   UNSPECIFIED REACTION OF CHILDHOOD Has patient had a PCN reaction causing immediate rash, facial/tongue/throat swelling, SOB or lightheadedness with hypotension: Unknown Has patient had a PCN reaction causing severe rash  involving mucus membranes or skin necrosis: Unknown Has patient had a PCN reaction that required hospitalization: Unknown Has patient had a PCN reaction occurring within the last 10 years: No If all of the above answers are "NO", then may proceed with Cephalosporin use.   Trintellix [vortioxetine]    Unsure of allergy   Morphine And Related Other (See Comments)   Hallucinations        Medication List     TAKE these medications    ALPRAZolam 1 MG tablet Commonly known as: XANAX Take 1 tablet (1 mg total) by mouth  3 (three) times daily. What changed: when to take this   atorvastatin 40 MG tablet Commonly known as: LIPITOR TAKE 1 TABLET BY MOUTH AT BEDTIME   [employer] D3 125 MCG (5000 UT) capsule Generic drug: Cholecalciferol TAKE 1 CAPSULE (5,000 UNITS TOTAL) BY MOUTH DAILY.   dicyclomine 20 MG tablet Commonly known as: BENTYL Take 1 tablet (20 mg total) by mouth 2 (two) times daily.   gabapentin 600 MG tablet Commonly known as: NEURONTIN Take 1.5 tablets (900 mg total) by mouth at bedtime. For agitation   glimepiride 2 MG tablet Commonly known as: AMARYL Take 2 mg by mouth daily with breakfast.   hydrOXYzine 25 MG tablet Commonly known as: ATARAX/VISTARIL Take 1 tablet (25 mg total) by mouth 3 (three) times daily as needed. for anxiety   levothyroxine 112 MCG tablet Commonly known as: SYNTHROID TAKE 1 TABLET BY MOUTH EVERY DAY BEFORE BREAKFAST   lisinopril 2.5 MG tablet Commonly known as: ZESTRIL Take 2.5 mg by mouth daily.   metFORMIN 500 MG tablet Commonly known as: GLUCOPHAGE Take 1 tablet (500 mg total) by mouth daily with supper. For diabetes management   methocarbamol 500 MG tablet Commonly known as: ROBAXIN Take 1 tablet (500 mg total) by mouth 2 (two) times daily as needed for muscle spasms. What changed: Another medication with the same name was added. Make sure you understand how and when to take each.   methocarbamol 500 MG tablet Commonly known as: Robaxin Take 1 tablet (500 mg total) by mouth 4 (four) times daily. What changed: You were already taking a medication with the same name, and this prescription was added. Make sure you understand how and when to take each.   methylPREDNISolone 4 MG Tbpk tablet Commonly known as: MEDROL DOSEPAK Taper over 6 days - PO once daily   naproxen 500 MG tablet Commonly known as: Naprosyn Take 1 tablet (500 mg total) by mouth 2 (two) times daily with a meal.   ondansetron 4 MG disintegrating tablet Commonly known as: Zofran  ODT Take 1 tablet (4 mg total) by mouth every 8 (eight) hours as needed for nausea or vomiting.   oxyCODONE-acetaminophen 5-325 MG tablet Commonly known as: PERCOCET/ROXICET Take 1 tablet by mouth every 6 (six) hours as needed for severe pain. What changed: Another medication with the same name was added. Make sure you understand how and when to take each.   oxyCODONE-acetaminophen 5-325 MG tablet Commonly known as: Percocet Take 1 tablet by mouth every 4 (four) hours as needed for severe pain. What changed: You were already taking a medication with the same name, and this prescription was added. Make sure you understand how and when to take each.   traZODone 100 MG tablet Commonly known as: DESYREL TAKE 1 TO 2 TABLETS BY MOUTH AT BEDTIME FOR SLEEP What changed:  how much to take how to take this when to take this additional  instructions   Vraylar 4.5 MG Caps Generic drug: Cariprazine HCl Take 1 capsule (4.5 mg total) by mouth daily.        Disposition: home   Final Dx: plif L3-4  Discharge Instructions      Remove dressing in 72 hours   Complete by: As directed    Call MD for:  difficulty breathing, headache or visual disturbances   Complete by: As directed    Call MD for:  hives   Complete by: As directed    Call MD for:  persistant dizziness or light-headedness   Complete by: As directed    Call MD for:  persistant nausea and vomiting   Complete by: As directed    Call MD for:  redness, tenderness, or signs of infection (pain, swelling, redness, odor or green/yellow discharge around incision site)   Complete by: As directed    Call MD for:  severe uncontrolled pain   Complete by: As directed    Call MD for:  temperature >100.4   Complete by: As directed    Diet - low sodium heart healthy   Complete by: As directed    Increase activity slowly   Complete by: As directed    Lifting restrictions   Complete by: As directed    No lifting more than 8 lbs           Signed: Tiana Loft Jennife Zaucha 11/25/2020, 8:08 AM

## 2020-11-25 NOTE — Evaluation (Signed)
Occupational Therapy Evaluation Patient Details Name: Glen Jacobson MRN: [id number] DOB: Nov 13, 1975 Today's Date: 11/25/2020   History of Present Illness Pt is a 45 yo male with back pain radiating down LEs L >R. Pt underwent PLIF L3-4. PMH: ADHD, Bipolar Do, PSH: L wrist surgery - s/p ORIF L ulna - 2022.   Clinical Impression   Pt admitted for procedure listed above. PTA pt reported that he was independent with all ADL's and IADL's, including working as a [employer]. At this time, pt continues to demonstrate independence with all ADL's, no concerns with functional mobility. Pt  also reports that his wife will be able to assist him with any thing that he may struggle with. He has no further OT needs at this time and acute OT will sign off.    Recommendations for follow up therapy are one component of a multi-disciplinary discharge planning process, led by the attending physician.  Recommendations may be updated based on patient status, additional functional criteria and insurance authorization.   Follow Up Recommendations  No OT follow up    Equipment Recommendations  None recommended by OT    Recommendations for Other Services       Precautions / Restrictions Precautions Precautions: Back Precaution Booklet Issued: Yes (comment) Precaution Comments: pt able to recall all precautions from previous surgery without looking at hand out Restrictions Weight Bearing Restrictions: No      Mobility Bed Mobility Overal bed mobility: Needs Assistance Bed Mobility: Rolling;Sidelying to Sit;Sit to Sidelying Rolling: Modified independent (Device/Increase time) Sidelying to sit: Modified independent (Device/Increase time)     Sit to sidelying: Modified independent (Device/Increase time) General bed mobility comments: verbal cues for log rolling and to roll to the L so he can push up on L elbow and not have to use L hand due to his restrictions    Transfers Overall transfer level:  Needs assistance Equipment used: None Transfers: Sit to/from Stand Sit to Stand: Modified independent (Device/Increase time)         General transfer comment: pt with good use of legs, increased time but steady    Balance Overall balance assessment: Mild deficits observed, not formally tested                                         ADL either performed or assessed with clinical judgement   ADL Overall ADL's : Modified independent                                       General ADL Comments: Pt remembers all compensatory techniques fromprior surgeries, able to do all ADL's safely with no assist, and reports wife can assist with any ADL's and IADL's asneeded.     Vision Baseline Vision/History: 0 No visual deficits Ability to See in Adequate Light: 0 Adequate Patient Visual Report: No change from baseline Vision Assessment?: No apparent visual deficits     Perception Perception Perception Tested?: No   Praxis Praxis Praxis tested?: Not tested    Pertinent Vitals/Pain Pain Assessment: 0-10 Pain Score: 9  Pain Location: back Pain Descriptors / Indicators: Discomfort Pain Intervention(s): Monitored during session     Hand Dominance Right   Extremity/Trunk Assessment Upper Extremity Assessment Upper Extremity Assessment: LUE deficits/detail LUE Deficits / Details: pt with recent ulna  break, pt with  4th and 5th fingers in flexion and hand on less than 5 pounds weight restriction. Pt reports no sensation in 4th and 5th fingers as well. LUE Sensation: decreased light touch LUE Coordination: decreased fine motor;decreased gross motor   Lower Extremity Assessment Lower Extremity Assessment: Defer to PT evaluation   Cervical / Trunk Assessment Cervical / Trunk Assessment: Other exceptions Cervical / Trunk Exceptions: recent back surgery   Communication Communication Communication: No difficulties   Cognition Arousal/Alertness:  Awake/alert Behavior During Therapy: WFL for tasks assessed/performed Overall Cognitive Status: Within Functional Limits for tasks assessed                                     General Comments  drain intact and VSS    Exercises     Shoulder Instructions      Home Living Family/patient expects to be discharged to:: Private residence Living Arrangements: Spouse/significant other Available Help at Discharge: Family;Available 24 hours/day Type of Home: House Home Access: Ramped entrance     Home Layout: One level     Bathroom Shower/Tub: Chief Strategy Officer: Handicapped height Bathroom Accessibility: Yes How Accessible: Accessible via walker Home Equipment: Walker - 2 wheels;Cane - single point          Prior Functioning/Environment Level of Independence: Independent                 OT Problem List: Decreased activity tolerance;Decreased range of motion;Pain      OT Treatment/Interventions:      OT Goals(Current goals can be found in the care plan section) Acute Rehab OT Goals Patient Stated Goal: stop the pain OT Goal Formulation: With patient/family Time For Goal Achievement: 11/25/20 Potential to Achieve Goals: Good  OT Frequency:     Barriers to D/C:            Co-evaluation              AM-PAC OT "6 Clicks" Daily Activity     Outcome Measure Help from another person eating meals?: None Help from another person taking care of personal grooming?: None Help from another person toileting, which includes using toliet, bedpan, or urinal?: None Help from another person bathing (including washing, rinsing, drying)?: None Help from another person to put on and taking off regular upper body clothing?: None Help from another person to put on and taking off regular lower body clothing?: None 6 Click Score: 24   End of Session Equipment Utilized During Treatment: Back brace Nurse Communication: Mobility status  Activity  Tolerance: Patient tolerated treatment well Patient left: in bed;with call bell/phone within reach;with family/visitor present  OT Visit Diagnosis: Muscle weakness (generalized) (M62.81)                Time: 1540-0867 OT Time Calculation (min): 13 min Charges:  OT General Charges $OT Visit: 1 Visit OT Evaluation $OT Eval Low Complexity: 1 Low  [name] H., OTR/L Acute Rehabilitation  Aryam Zhan Elane Merlin Golden 11/25/2020, 11:17 AM

## 2020-11-25 NOTE — Plan of Care (Signed)

## 2020-11-25 NOTE — Progress Notes (Signed)
Patient was transported via wheelchair by volunteer for discharge home; in no acute distress nor complaints of pain nor discomfort; dressing on his back was clean, dry and intact;  room was checked and accounted for all his belongings; discharge instructions concerning his medications, wound care and follow-up appointment given to patient by RN and he verbalized understanding on the instructions given.

## 2020-11-25 NOTE — Discharge Instructions (Signed)

## 2021-01-13 ENCOUNTER — Other Ambulatory Visit: Payer: Self-pay

## 2021-01-13 ENCOUNTER — Encounter (HOSPITAL_COMMUNITY): Payer: Self-pay | Admitting: Psychiatry

## 2021-01-13 ENCOUNTER — Telehealth (INDEPENDENT_AMBULATORY_CARE_PROVIDER_SITE_OTHER): Payer: 59 | Admitting: Psychiatry

## 2021-01-13 DIAGNOSIS — F3162 Bipolar disorder, current episode mixed, moderate: Secondary | ICD-10-CM | POA: Diagnosis not present

## 2021-01-13 MED ORDER — HYDROXYZINE HCL 25 MG PO TABS: 25.0000 mg | ORAL_TABLET | Freq: Three times a day (TID) | ORAL | 2 refills | Status: DC | PRN

## 2021-01-13 MED ORDER — ALPRAZOLAM 1 MG PO TABS: 1.0000 mg | ORAL_TABLET | Freq: Three times a day (TID) | ORAL | 2 refills | Status: DC

## 2021-01-13 MED ORDER — VRAYLAR 4.5 MG PO CAPS: 4.5000 mg | ORAL_CAPSULE | Freq: Every day | ORAL | 2 refills | Status: DC

## 2021-01-13 MED ORDER — GABAPENTIN 600 MG PO TABS: 900.0000 mg | ORAL_TABLET | Freq: Every day | ORAL | 2 refills | Status: AC

## 2021-01-13 MED ORDER — TRAZODONE HCL 100 MG PO TABS: ORAL_TABLET | ORAL | 2 refills | Status: DC
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
## 2021-04-03 ENCOUNTER — Other Ambulatory Visit (HOSPITAL_COMMUNITY): Payer: Self-pay | Admitting: Psychiatry

## 2021-05-02 ENCOUNTER — Other Ambulatory Visit (HOSPITAL_COMMUNITY): Payer: Self-pay | Admitting: Psychiatry

## 2021-05-02 NOTE — Telephone Encounter (Signed)
Call for appt

## 2021-05-16 ENCOUNTER — Telehealth (HOSPITAL_COMMUNITY): Payer: Self-pay | Admitting: *Deleted

## 2021-05-16 NOTE — Telephone Encounter (Signed)
He is on 4 medications that I prescribe. Is he stopping them? If not he will need to be seen at a minimum of every 6 months

## 2021-05-16 NOTE — Telephone Encounter (Signed)
Spoke with patient due to needing to schedule follow up appt per provider. Spoke with patient and he stated he did not want to schedule follow up appt due to him not wanting to and needing to anymore. Per pt he even started decreasing his Xanax and should be fine and still have 3 more bottles. Per pt he also don't want to schedule due to not having the money. Staff informed patient of [place] services to help him with finance and patient stated his wife makes too much if they base it off of household income. Per he still don't feel like he needs the appt anymore. Staff informed patient that message will be sent to provider and patient verbalized understanding and stated he's fine with it. ?

## 2021-05-17 NOTE — Telephone Encounter (Signed)
Okay, but no more refills unless he reestablished care here

## 2021-05-17 NOTE — Telephone Encounter (Signed)
noted 

## 2021-05-17 NOTE — Telephone Encounter (Signed)
Spoke with patient and he stated that he stopped taking all of the medications that provider prescribed for him. Informed him with what provider stated and he verbalized understanding and still turned down scheduling appt. But did not turn down keeping his chart open.  ?

## 2021-05-24 ENCOUNTER — Other Ambulatory Visit: Payer: Self-pay | Admitting: Neurosurgery

## 2021-05-24 DIAGNOSIS — M5440 Lumbago with sciatica, unspecified side: Secondary | ICD-10-CM

## 2021-05-30 ENCOUNTER — Other Ambulatory Visit (HOSPITAL_COMMUNITY): Payer: Self-pay | Admitting: Psychiatry

## 2021-06-21 ENCOUNTER — Telehealth (HOSPITAL_COMMUNITY): Payer: Self-pay | Admitting: *Deleted

## 2021-06-21 NOTE — Telephone Encounter (Signed)
Informed patient with what provider stated and he stated he is sure he do not want to be seen and for provider to just send him a discharge letter. Pt declined resch appt with provider.  ?

## 2021-06-21 NOTE — Telephone Encounter (Signed)
Staff called patient to schedule f/u appt due to not being seen since 01/13/2021. ? ?Per pt he don't feel like he need to schedule a f/u appt and pay $50 just to tell her he's not taking his medications anymore.  ? ?Per pt, he do not plan on coming back and he stopped taking his medication and that's all he needed her for. Been off of medications for approx. 26yr ago.  ? ?Per pt it's okay if provider close out his chart.  ? ?Patient declined rescheduling appointment with provider.  ? ?Staff called pharmacy to see when was the last time patient picked up his Xanax and staff was informed 06/08/2021, 05/10/2021, 04/12/2021 and 03/14/2021 and patient was the one that picked up the script.  ? ?Trazodone was last picked up 05/10/2021,  ? ?Gabapentin was last picked up 06/08/2021 ? ?[facility] [place] last [facility] 2021 ? ?Hydroxyzine was last picked up 06/08/2021 ? ?Per pharmacy [facility]) patient fills and picks up his medication ones a month ?

## 2021-06-21 NOTE — Telephone Encounter (Signed)
Since he is still taking the meds he will need to be seen. Also, since he is prescribed xanax he will need to be seen in person for next visit. If he doesn't want to be seen here we can send a d/c letter and all scripts will cease after 30 days

## 2021-06-21 NOTE — Telephone Encounter (Signed)
Please get d/c letter ready, casll pharmacies to make sure he only has 30 days of refills.

## 2021-06-28 ENCOUNTER — Other Ambulatory Visit (HOSPITAL_COMMUNITY): Payer: Self-pay | Admitting: Psychiatry

## 2021-07-28 ENCOUNTER — Other Ambulatory Visit (HOSPITAL_COMMUNITY): Payer: Self-pay | Admitting: Psychiatry

## 2021-08-08 ENCOUNTER — Other Ambulatory Visit (HOSPITAL_COMMUNITY): Payer: Self-pay | Admitting: Psychiatry

## 2021-09-22 ENCOUNTER — Encounter (HOSPITAL_COMMUNITY): Payer: Self-pay | Admitting: *Deleted

## 2021-09-22 NOTE — Telephone Encounter (Signed)
Patient is dismissed since 06/21/2021

## 2021-12-20 ENCOUNTER — Ambulatory Visit (INDEPENDENT_AMBULATORY_CARE_PROVIDER_SITE_OTHER): Payer: 59

## 2021-12-20 ENCOUNTER — Encounter: Payer: Self-pay | Admitting: Orthopaedic Surgery

## 2021-12-20 ENCOUNTER — Telehealth: Payer: Self-pay | Admitting: Physician Assistant

## 2021-12-20 ENCOUNTER — Ambulatory Visit (INDEPENDENT_AMBULATORY_CARE_PROVIDER_SITE_OTHER): Payer: 59 | Admitting: Orthopaedic Surgery

## 2021-12-20 VITALS — Ht 71.0 in | Wt 290.0 lb

## 2021-12-20 DIAGNOSIS — M25551 Pain in right hip: Secondary | ICD-10-CM

## 2021-12-20 DIAGNOSIS — M25552 Pain in left hip: Secondary | ICD-10-CM

## 2021-12-20 MED ORDER — TRAMADOL HCL 50 MG PO TABS: 50.0000 mg | ORAL_TABLET | Freq: Two times a day (BID) | ORAL | 2 refills | Status: AC | PRN
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
## 2021-12-20 NOTE — Telephone Encounter (Signed)
Patient advised he needs prior arth. For his tramadol 50mg . Pharmacy is [facility]. [phone].  Patients number in chart.  Pt advised he was given a 7 day supply. Rx is 1 t po bid.

## 2022-01-07 ENCOUNTER — Ambulatory Visit
Admission: RE | Admit: 2022-01-07 | Discharge: 2022-01-07 | Disposition: A | Discharge status: Home / Self Care | Payer: 59 | Source: Ambulatory Visit | Attending: Orthopaedic Surgery | Admitting: Orthopaedic Surgery

## 2022-01-07 DIAGNOSIS — M25552 Pain in left hip: Secondary | ICD-10-CM

## 2022-01-11 ENCOUNTER — Ambulatory Visit: Payer: 59 | Admitting: Orthopaedic Surgery

## 2022-01-11 ENCOUNTER — Encounter: Payer: Self-pay | Admitting: Orthopaedic Surgery

## 2022-01-11 DIAGNOSIS — M1611 Unilateral primary osteoarthritis, right hip: Secondary | ICD-10-CM | POA: Diagnosis not present

## 2022-01-11 DIAGNOSIS — M1612 Unilateral primary osteoarthritis, left hip: Secondary | ICD-10-CM | POA: Diagnosis not present

## 2022-01-11 MED ORDER — DICLOFENAC SODIUM 75 MG PO TBEC: 75.0000 mg | DELAYED_RELEASE_TABLET | Freq: Two times a day (BID) | ORAL | 2 refills | Status: AC
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
## 2022-01-12 ENCOUNTER — Ambulatory Visit: Payer: Self-pay

## 2022-01-12 ENCOUNTER — Encounter: Payer: Self-pay | Admitting: Sports Medicine

## 2022-01-12 ENCOUNTER — Ambulatory Visit: Payer: 59 | Admitting: Sports Medicine

## 2022-01-12 DIAGNOSIS — M1611 Unilateral primary osteoarthritis, right hip: Secondary | ICD-10-CM

## 2022-01-12 DIAGNOSIS — M25552 Pain in left hip: Secondary | ICD-10-CM

## 2022-01-12 DIAGNOSIS — M1612 Unilateral primary osteoarthritis, left hip: Secondary | ICD-10-CM | POA: Diagnosis not present

## 2022-01-12 DIAGNOSIS — M25551 Pain in right hip: Secondary | ICD-10-CM | POA: Diagnosis not present

## 2022-01-12 MED ORDER — LIDOCAINE HCL 1 % IJ SOLN
4.0000 mL | INTRAMUSCULAR | Status: AC | PRN
  Administered 2022-01-12: 4 mL

## 2022-01-12 MED ORDER — METHYLPREDNISOLONE ACETATE 40 MG/ML IJ SUSP
40.0000 mg | INTRAMUSCULAR | Status: AC | PRN
  Administered 2022-01-12: 40 mg via INTRA_ARTICULAR

## 2022-01-12 NOTE — Progress Notes (Signed)
   Procedure Note  Patient: Glen Jacobson             Date of Birth: 03/20/1975           MRN: [id number]             Visit Date: 01/12/2022  Procedures: Visit Diagnoses:  1. Primary osteoarthritis of right hip    Large Joint Inj: bilateral hip joint on 01/12/2022 2:27 PM Indications: pain Details: 22 G 3.5 in needle, ultrasound-guided anterior approach Medications (Right): 4 mL lidocaine 1 %; 40 mg methylPREDNISolone acetate 40 MG/ML Medications (Left): 4 mL lidocaine 1 %; 40 mg methylPREDNISolone acetate 40 MG/ML Outcome: tolerated well, no immediate complications  Procedure: US-guided intra-articular hip injection, bilateral After discussion on risks/benefits/indications and informed verbal consent was obtained, a timeout was performed. Patient was lying supine on exam table. The right hip was cleaned with betadine and alcohol swabs. Then utilizing ultrasound guidance, the patient's femoral head and neck junction was identified and subsequently injected with 4:2 lidocaine:depomedrol via an in-plane approach with ultrasound visualization of the injectate administered into the hip joint. Following this, the The left hip was cleaned with betadine and alcohol swabs. Then utilizing ultrasound guidance, the patient's femoral head and neck junction was identified and subsequently injected with 4:2 lidocaine:depomedrol via an in-plane approach with ultrasound visualization of the injectate administered into the hip joint. Patient tolerated procedure well without immediate complications.  Procedure, treatment alternatives, risks and benefits explained, specific risks discussed. Consent was given by the patient. Immediately prior to procedure a time out was called to verify the correct patient, procedure, equipment, support staff and site/side marked as required. Patient was prepped and draped in the usual sterile fashion.     - I evaluated the patient about 10 minutes post-injection and he had  great improvement in pain and range of motion - follow-up with Dr. Roda Shutters as indicated; I am happy to see them as needed  Madelyn Brunner, DO Primary Care Sports Medicine Physician  [facility]  This note was dictated using Dragon naturally speaking software and may contain errors in syntax, spelling, or content which have not been identified prior to signing this note.

## 2022-01-17 ENCOUNTER — Encounter (INDEPENDENT_AMBULATORY_CARE_PROVIDER_SITE_OTHER): Payer: Self-pay

## 2022-05-02 IMAGING — MR MR LUMBAR SPINE WO/W CM
4 of 7 series · 14 of 48 positions shown · IV contrast (10 ml Gadavist)
Comparison: MRI 11/14/2018

CLINICAL DATA: Low back pain with right leg numbness. History of
lumbar surgery in 6016.

EXAM:
MRI LUMBAR SPINE WITHOUT AND WITH CONTRAST
TECHNIQUE: Multiplanar and multiecho pulse sequences of the lumbar spine were
obtained without and with intravenous contrast.
CONTRAST:  10mL GADAVIST GADOBUTROL 1 MMOL/ML IV SOLN

[Series 3: T2 · sagittal · 4.0mm · 0.47mm/px · 5 of 20 slices shown (1 of 2)]
[im 1/20]
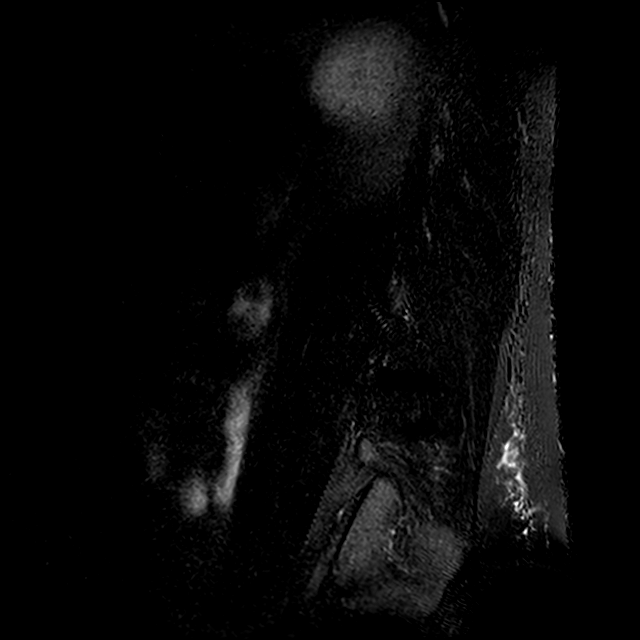
[im 5/20]
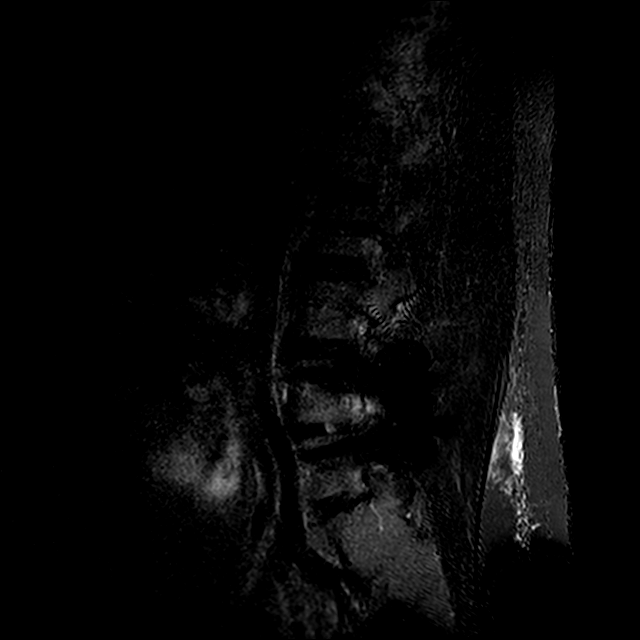
[im 10/20]
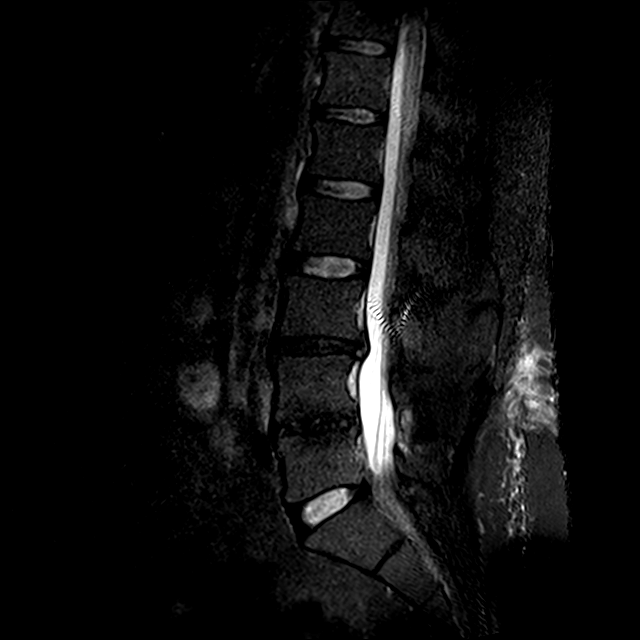
[im 15/20]
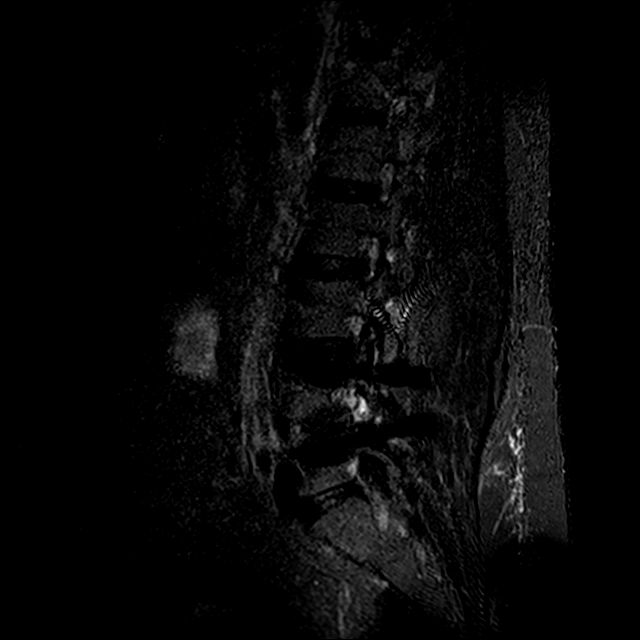
[im 20/20]
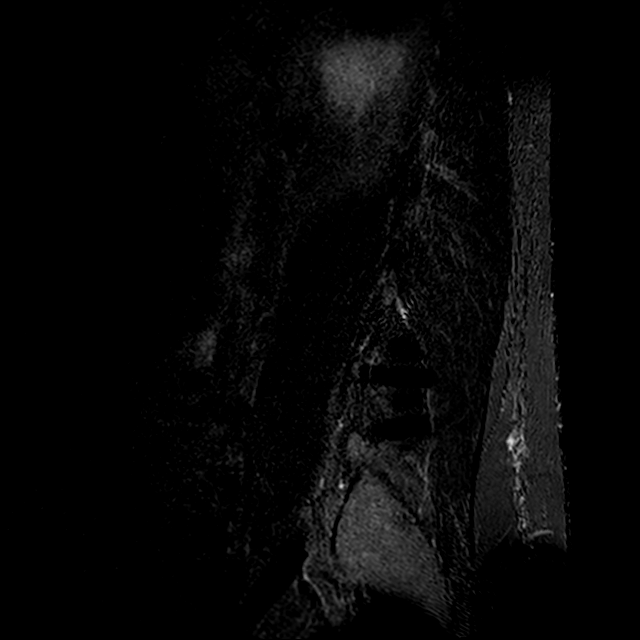

[Series 4: T1 · sagittal · 4.0mm · 0.47mm/px · 3 of 17 slices shown]
[im 1/17]
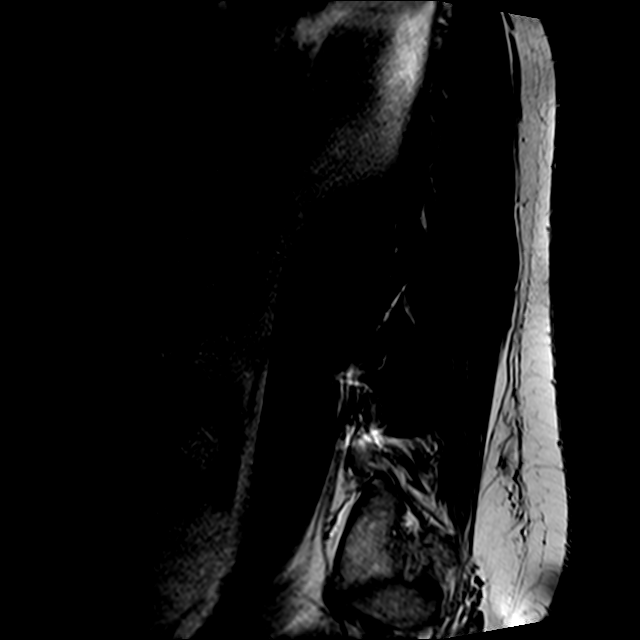
[im 9/17]
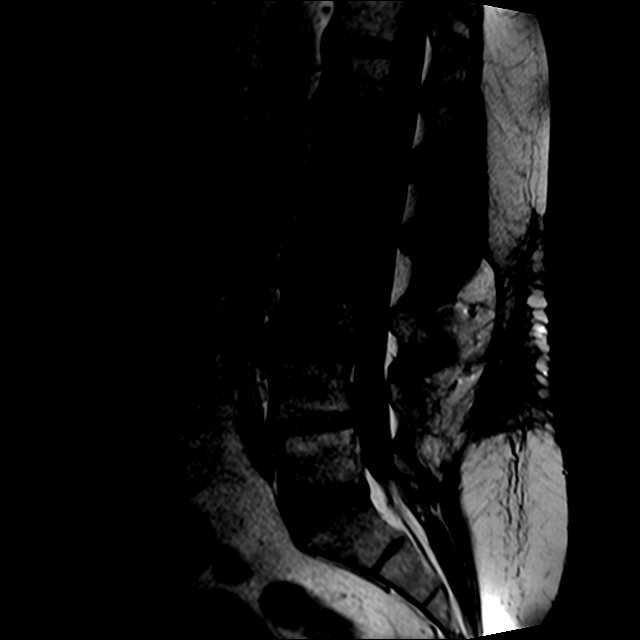
[im 17/17]
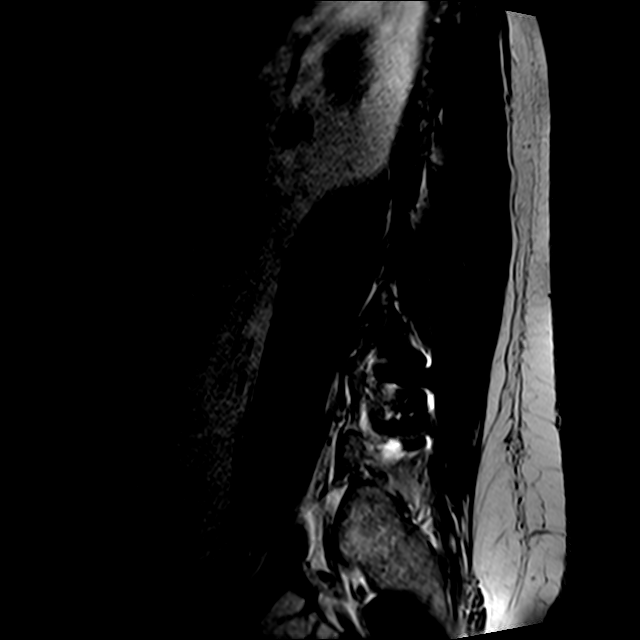

[Series 6: T2 · axial · 4.0mm · 0.46mm/px · z∈[-101,+97]mm · 3 of 53 slices shown (2 of 2)]
[im 6/53]
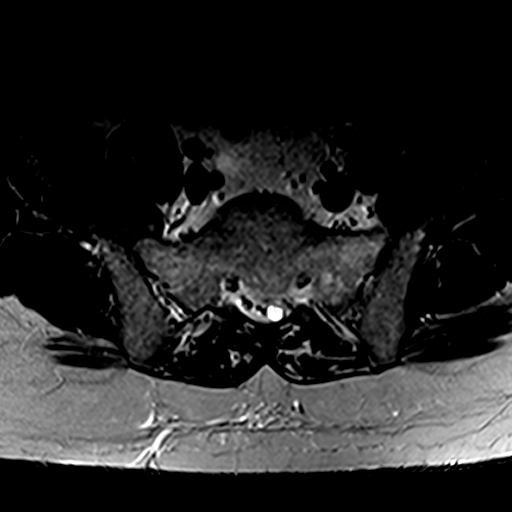
[im 27/53]
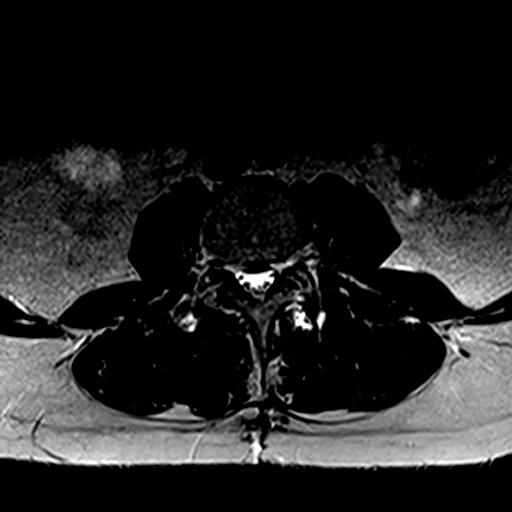
[im 47/53]
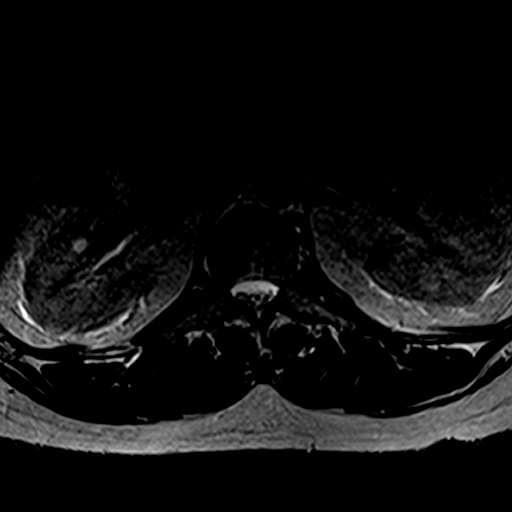

[Series 7: T2 post-contrast · sagittal · 4.0mm · 0.47mm/px · 3 of 17 slices shown]
[im 1/17]
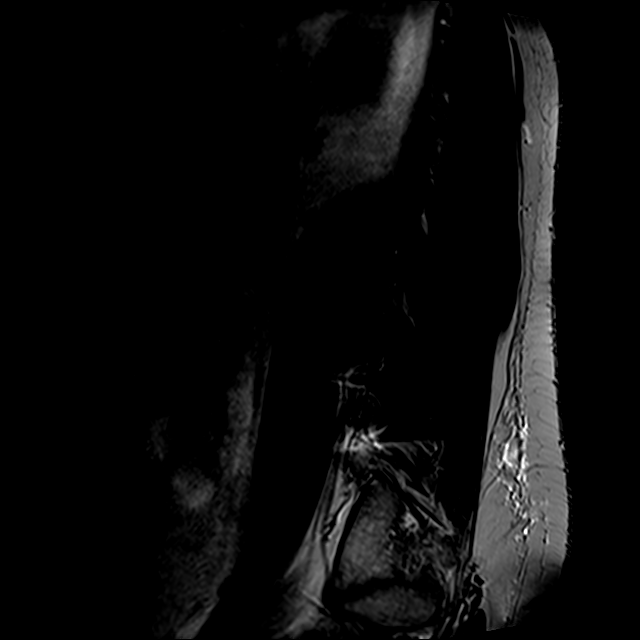
[im 9/17]
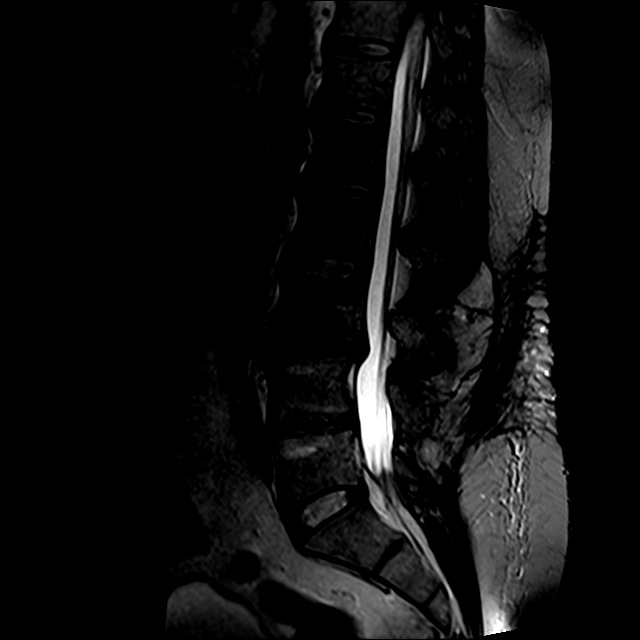
[im 17/17]
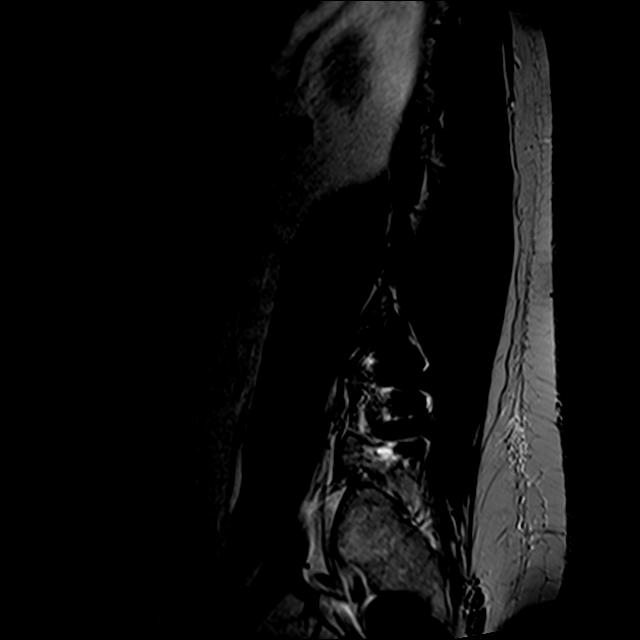

[14 of 48 positions shown; findings below may reference images not displayed]

FINDINGS: Segmentation:  Standard.

Alignment:  Physiologic.

Vertebrae: No fracture, evidence of discitis, or bone lesion. Prior
L4-5 PLIF.

Conus medullaris and cauda equina: Conus extends to the T12-L1
level. Conus and cauda equina appear normal. Subtle enhancement of
the right S1 ventral nerve root appears less prominent compared to
prior MRI.

Paraspinal and other soft tissues: Negative.

Disc levels:

T12-L1: Unremarkable.

L1-L2: Unremarkable.

L2-L3: Tiny biforaminal disc protrusions, right slightly greater
than left. No foraminal or canal stenosis. Unchanged.

L3-L4: Slight interval enlargement of broad-based posterior disc
protrusion resulting in moderate bilateral subarticular recess
stenosis with impingement of the bilateral descending L4 nerve roots
(series 6, image 29). Mild canal stenosis. Bilateral foramina remain
patent.

L4-L5: Prior PLIF. No residual foraminal or canal stenosis. No
abnormal enhancement.

L5-S1: No significant disc protrusion, foraminal stenosis, or canal
stenosis.
IMPRESSION: 1. Slight interval enlargement of broad-based posterior disc
protrusion at L3-4 resulting in moderate bilateral subarticular
recess stenosis with impingement of the bilateral descending L4
nerve roots.
2. Mild canal stenosis at L3-4.
3. Prior L4-5 PLIF. No residual foraminal or canal stenosis.
4. Subtle enhancement of the right S1 ventral nerve root appears
less prominent compared to prior MRI and may reflect a resolving
chronic radiculitis.

## 2022-05-02 IMAGING — DX DG LUMBAR SPINE 2-3V
2 series · 2 of 2 positions shown · non-contrast
Comparison: 11/04/2018

CLINICAL DATA: Evaluate prior fusion hardware

EXAM:
LUMBAR SPINE - 2 VIEW

[l-spine ap]
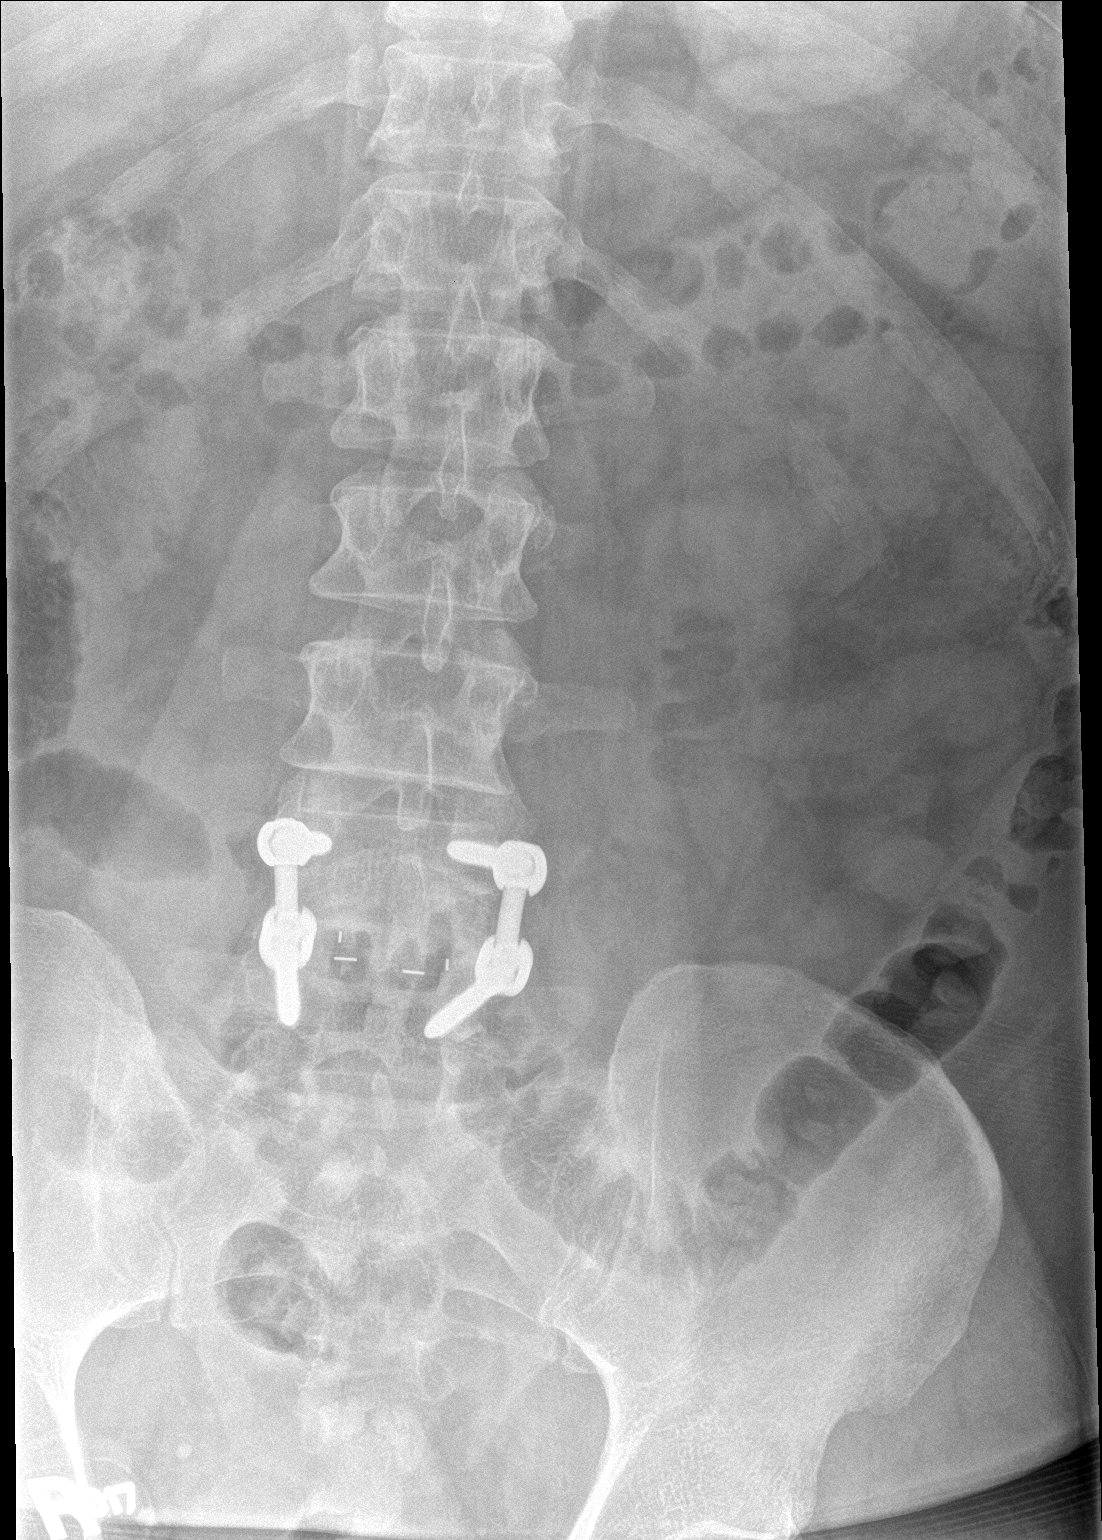

[l-spine lat]
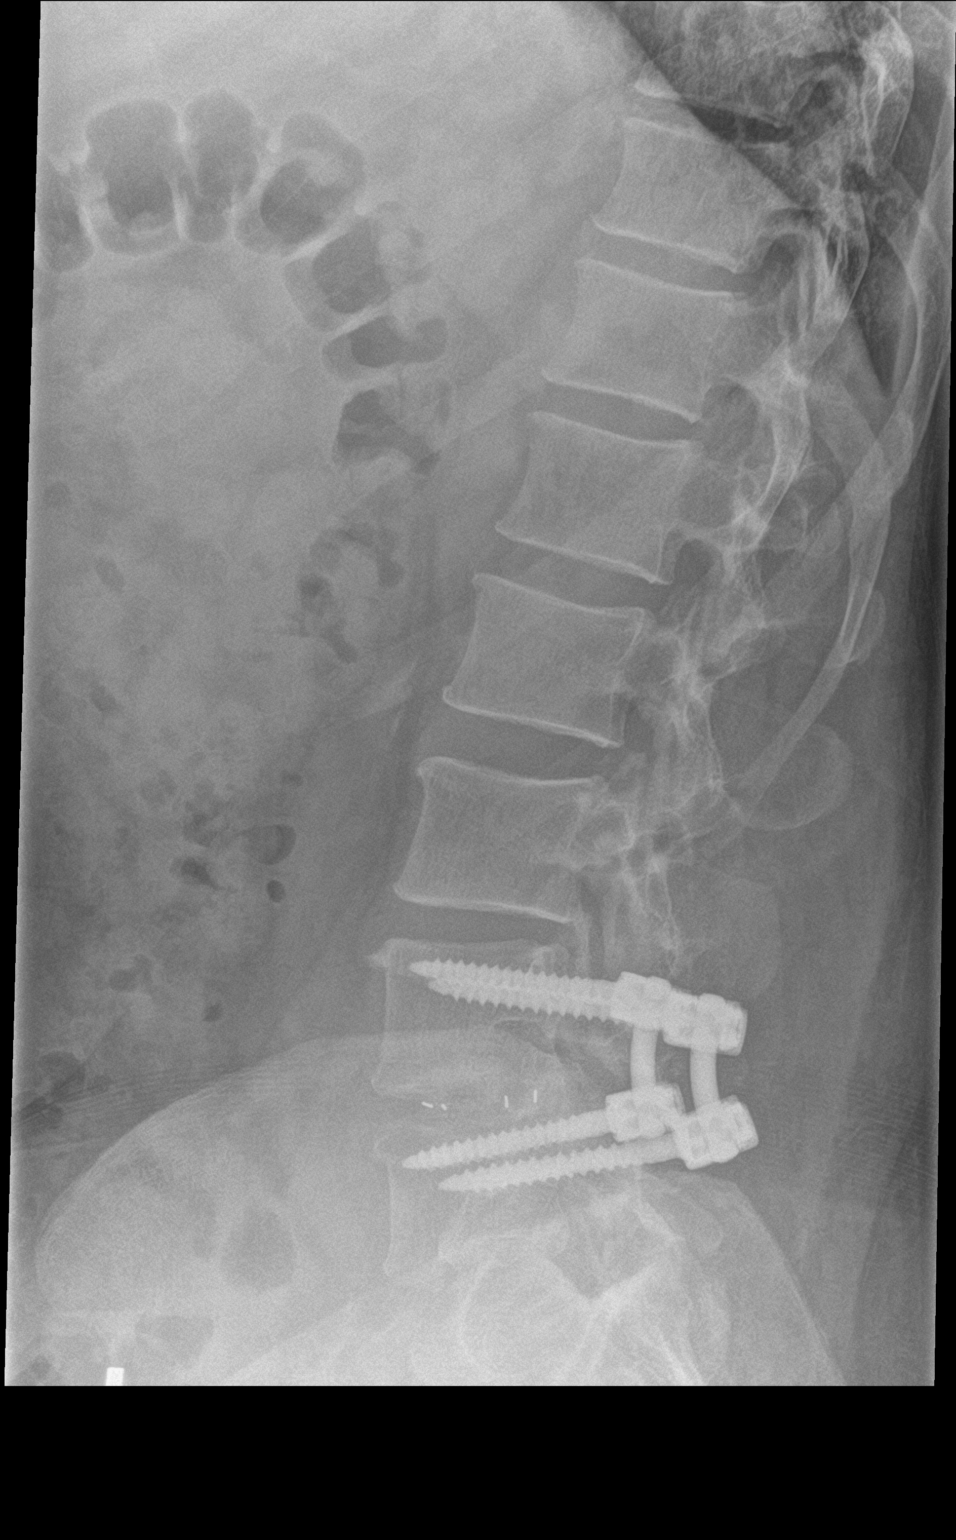

[2 of 2 positions shown; findings below may reference images not displayed]

FINDINGS: Changes of prior fusion at L4-5 are noted with pedicle screw
placement and posterior fixation. The overall appearance of the
hardware is stable. No compression deformity is seen. No soft tissue
abnormality is seen.
IMPRESSION: Status post fusion at L4-5 with stable appearing hardware.

## 2022-05-08 ENCOUNTER — Emergency Department
Admission: EM | Admit: 2022-05-08 | Discharge: 2022-05-08 | Disposition: A | Discharge status: Home / Self Care | Payer: Worker's Compensation | Attending: Emergency Medicine | Admitting: Emergency Medicine

## 2022-05-08 ENCOUNTER — Other Ambulatory Visit: Payer: Self-pay

## 2022-05-08 ENCOUNTER — Encounter: Payer: Self-pay | Admitting: Emergency Medicine

## 2022-05-08 ENCOUNTER — Emergency Department: Payer: Worker's Compensation

## 2022-05-08 DIAGNOSIS — E039 Hypothyroidism, unspecified: Secondary | ICD-10-CM | POA: Diagnosis not present

## 2022-05-08 DIAGNOSIS — M7989 Other specified soft tissue disorders: Secondary | ICD-10-CM | POA: Insufficient documentation

## 2022-05-08 DIAGNOSIS — W010XXA Fall on same level from slipping, tripping and stumbling without subsequent striking against object, initial encounter: Secondary | ICD-10-CM | POA: Insufficient documentation

## 2022-05-08 DIAGNOSIS — Y9389 Activity, other specified: Secondary | ICD-10-CM | POA: Diagnosis not present

## 2022-05-08 DIAGNOSIS — S8002XA Contusion of left knee, initial encounter: Secondary | ICD-10-CM | POA: Insufficient documentation

## 2022-05-08 DIAGNOSIS — S8992XA Unspecified injury of left lower leg, initial encounter: Secondary | ICD-10-CM | POA: Diagnosis present

## 2022-05-08 NOTE — Discharge Instructions (Signed)
Your x-ray is normal.  You may continue to rest, ice, elevate your leg.  You may wear the brace for extra support, remove it at night.  Please return for any new, worsening, or change in symptoms or other concerns.  It was a pleasure caring for you today.

## 2022-05-08 NOTE — ED Provider Notes (Signed)
[facility] Provider Note    Event Date/Time   First MD Initiated Contact with Patient 05/08/22 1137     (approximate)   History   Knee Injury   HPI  Glen Jacobson is a 47 y.o. male with a past medical history of bipolar disorder, hyperlipidemia, chronic back pain who presents today for evaluation of knee pain.  Patient reports that he was carrying things and tripped and fell directly onto his knee.  He denies numbness or tingling.  He reports that he has pain with weightbearing but he is able to bear weight.  No head strike or LOC, no other injury sustained.  Patient Active Problem List   Diagnosis Date Noted   Spinal stenosis of lumbar region 11/24/2020   Generalized anxiety disorder 01/12/2018   Bipolar I disorder with mixed features ([place]) 01/11/2018   HNP (herniated nucleus pulposus), lumbar 07/11/2017   Current smoker 08/04/2016   Other specified hypothyroidism 03/29/2015   Pre-diabetes 03/29/2015   Mixed hyperlipidemia 03/29/2015   Vitamin D deficiency 03/29/2015   ADHD (attention deficit hyperactivity disorder), combined type 07/24/2014   Bipolar I disorder, most recent episode (or current) mixed, moderate 05/29/2013   Spinal stenosis, lumbar region, 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   Herniated lumbar intervertebral disc 04/02/2013   Spinal stenosis, lumbar 04/02/2013          Physical Exam   Triage Vital Signs: ED Triage Vitals  Enc Vitals Group     BP 05/08/22 1127 (!) 141/89     Pulse Rate 05/08/22 1127 88     Resp 05/08/22 1127 18     Temp 05/08/22 1127 97.8 F (36.6 C)     Temp Source 05/08/22 1127 Oral     SpO2 05/08/22 1127 96 %     Weight --      Height --      Head Circumference --      Peak Flow --      Pain Score 05/08/22 1125 10     Pain Loc --      Pain Edu? --      Excl. in [place]? --     Most recent vital signs: Vitals:   05/08/22 1127  BP: (!) 141/89  Pulse: 88  Resp: 18  Temp: 97.8 F (36.6 C)   SpO2: 96%    Physical Exam Vitals and nursing note reviewed.  Constitutional:      General: Awake and alert. No acute distress.    Appearance: Normal appearance. The patient is obese.  HENT:     Head: Normocephalic and atraumatic.     Mouth: Mucous membranes are moist.  Eyes:     General: PERRL. Normal EOMs        Right eye: No discharge.        Left eye: No discharge.     Conjunctiva/sclera: Conjunctivae normal.  Cardiovascular:     Rate and Rhythm: Normal rate and regular rhythm.     Pulses: Normal pulses.     Heart sounds: Normal heart sounds Pulmonary:     Effort: Pulmonary effort is normal. No respiratory distress.     Breath sounds: Normal breath sounds.  Abdominal:     Abdomen is soft. There is no abdominal tenderness. No rebound or guarding. No distention. Musculoskeletal:        General: No swelling. Normal range of motion.     Cervical back: Normal range of motion and neck supple.  Left knee: No deformity  or rash.  Medial and inferior joint line tenderness. No patellar tenderness, no ballotment Warm and well perfused extremity with 2+ pedal pulses 5/5 strength to dorsiflexion and plantarflexion at the ankle with intact sensation throughout extremity Normal range of motion of the knee, with intact flexion and extension to active and passive range of motion. Extensor mechanism intact. No ligamentous laxity. Negative anterior/posterior drawer/negative lachman, negative mcmurrays No effusion or warmth Intact quadriceps, hamstring function, patellar tendon function Pelvis stable Full ROM of ankle without pain or swelling Foot warm and well perfused Skin:    General: Skin is warm and dry.     Capillary Refill: Capillary refill takes less than 2 seconds.     Findings: No rash.  Neurological:     Mental Status: The patient is awake and alert.      ED Results / Procedures / Treatments   Labs (all labs ordered are listed, but only abnormal results are  displayed) Labs Reviewed - No data to display   EKG     RADIOLOGY I independently reviewed and interpreted imaging and agree with radiologists findings.     PROCEDURES:  Critical Care performed:   Procedures   MEDICATIONS ORDERED IN ED: Medications - No data to display   IMPRESSION / MDM / [place] / ED COURSE  I reviewed the triage vital signs and the nursing notes.   Differential diagnosis includes, but is not limited to, contusion, fracture, ligamental injury, effusion.  Patient is awake and alert, hemodynamically stable and afebrile.  No other injury sustained.  No history or LOC, no headache, cervical spine tenderness, indication for CT head per [place] criteria.  No evidence of neurological deficit or vascular compromise on exam.  Normal pedal pulses, sensation intact light touch throughout.  No fracture/dislocation on X-Ray. No deformity or obvious ligamentous laxity on exam. No constitutional symptoms or effusion to suggest septic joint. No history of immunosuppression. Overall well appearing, vital signs stable. No indication for diagnostic or therapeutic procedure such as arthrocentesis.  Patient is reassured by these findings.  I recommended close outpatient follow-up and return precautions.  Patient understands and agrees with plan.  He was discharged in stable condition.  Return precautions and care instructions discussed.    Patient's presentation is most consistent with acute complicated illness / injury requiring diagnostic workup.    FINAL CLINICAL IMPRESSION(S) / ED DIAGNOSES   Final diagnoses:  Contusion of left knee, initial encounter     Rx / DC Orders   ED Discharge Orders     None        Note:  This document was prepared using Dragon voice recognition software and may include unintentional dictation errors.   Emeline Gins 05/08/22 1444    Lavonia Drafts, MD 05/08/22 1446

## 2022-05-08 NOTE — ED Triage Notes (Signed)
[facility] and injuring left knee while working. Patient states he is unable to walk on knee and feels like it is broke.

## 2022-11-24 ENCOUNTER — Other Ambulatory Visit (HOSPITAL_COMMUNITY): Payer: Self-pay | Admitting: Internal Medicine

## 2022-11-24 ENCOUNTER — Encounter (HOSPITAL_COMMUNITY): Payer: Self-pay | Admitting: Internal Medicine

## 2022-11-24 DIAGNOSIS — R7989 Other specified abnormal findings of blood chemistry: Secondary | ICD-10-CM

## 2022-11-27 ENCOUNTER — Ambulatory Visit (HOSPITAL_COMMUNITY)
Admission: RE | Admit: 2022-11-27 | Discharge: 2022-11-27 | Disposition: A | Discharge status: Home / Self Care | Payer: 59 | Source: Ambulatory Visit | Attending: Internal Medicine | Admitting: Internal Medicine

## 2022-11-27 DIAGNOSIS — R7989 Other specified abnormal findings of blood chemistry: Secondary | ICD-10-CM | POA: Diagnosis present

## 2023-01-26 ENCOUNTER — Other Ambulatory Visit (HOSPITAL_COMMUNITY): Payer: Self-pay | Admitting: Otolaryngology

## 2023-01-26 DIAGNOSIS — R2230 Localized swelling, mass and lump, unspecified upper limb: Secondary | ICD-10-CM

## 2023-02-08 ENCOUNTER — Ambulatory Visit (HOSPITAL_COMMUNITY)
Admission: RE | Admit: 2023-02-08 | Discharge: 2023-02-08 | Disposition: A | Discharge status: Home / Self Care | Payer: 59 | Source: Ambulatory Visit | Attending: Otolaryngology | Admitting: Otolaryngology

## 2023-02-08 DIAGNOSIS — R2230 Localized swelling, mass and lump, unspecified upper limb: Secondary | ICD-10-CM | POA: Insufficient documentation
